# Patient Record
Sex: Male | Born: 1975 | Race: White | Hispanic: No | Marital: Married | Smoking: Never smoker
Health system: Southern US, Community
[De-identification: ages and names within clinical notes are randomized; demographics above are authoritative.]

## PROBLEM LIST (undated history)

## (undated) DIAGNOSIS — I10 Essential (primary) hypertension: Secondary | ICD-10-CM

## (undated) DIAGNOSIS — F988 Other specified behavioral and emotional disorders with onset usually occurring in childhood and adolescence: Secondary | ICD-10-CM

## (undated) DIAGNOSIS — F419 Anxiety disorder, unspecified: Secondary | ICD-10-CM

## (undated) DIAGNOSIS — K219 Gastro-esophageal reflux disease without esophagitis: Secondary | ICD-10-CM

## (undated) DIAGNOSIS — F4310 Post-traumatic stress disorder, unspecified: Secondary | ICD-10-CM

## (undated) DIAGNOSIS — F32A Depression, unspecified: Secondary | ICD-10-CM

## (undated) HISTORY — PX: APPENDECTOMY: SHX54

## (undated) HISTORY — DX: Depression, unspecified: F32.A

---

## 1998-06-21 ENCOUNTER — Emergency Department (HOSPITAL_COMMUNITY): Admission: EM | Admit: 1998-06-21 | Discharge: 1998-06-22 | Payer: Self-pay | Admitting: Emergency Medicine

## 1998-06-23 ENCOUNTER — Emergency Department (HOSPITAL_COMMUNITY): Admission: EM | Admit: 1998-06-23 | Discharge: 1998-06-24 | Payer: Self-pay | Admitting: Emergency Medicine

## 1998-07-15 ENCOUNTER — Ambulatory Visit (HOSPITAL_COMMUNITY): Admission: RE | Admit: 1998-07-15 | Discharge: 1998-07-15 | Payer: Self-pay | Admitting: Psychiatry

## 1998-11-11 ENCOUNTER — Emergency Department (HOSPITAL_COMMUNITY): Admission: EM | Admit: 1998-11-11 | Discharge: 1998-11-11 | Payer: Self-pay | Admitting: Emergency Medicine

## 1999-02-25 ENCOUNTER — Encounter: Payer: Self-pay | Admitting: Emergency Medicine

## 1999-02-25 ENCOUNTER — Emergency Department (HOSPITAL_COMMUNITY): Admission: EM | Admit: 1999-02-25 | Discharge: 1999-02-25 | Payer: Self-pay | Admitting: Emergency Medicine

## 2000-08-19 ENCOUNTER — Emergency Department (HOSPITAL_COMMUNITY): Admission: EM | Admit: 2000-08-19 | Discharge: 2000-08-19 | Payer: Self-pay | Admitting: *Deleted

## 2002-03-18 ENCOUNTER — Ambulatory Visit (HOSPITAL_BASED_OUTPATIENT_CLINIC_OR_DEPARTMENT_OTHER): Admission: RE | Admit: 2002-03-18 | Discharge: 2002-03-18 | Payer: Self-pay | Admitting: Dentistry

## 2003-11-21 ENCOUNTER — Emergency Department (HOSPITAL_COMMUNITY): Admission: EM | Admit: 2003-11-21 | Discharge: 2003-11-21 | Payer: Self-pay | Admitting: Emergency Medicine

## 2006-09-24 ENCOUNTER — Emergency Department (HOSPITAL_COMMUNITY): Admission: EM | Admit: 2006-09-24 | Discharge: 2006-09-24 | Payer: Self-pay | Admitting: Emergency Medicine

## 2006-11-01 ENCOUNTER — Encounter: Admission: RE | Admit: 2006-11-01 | Discharge: 2007-01-30 | Payer: Self-pay | Admitting: Orthopedic Surgery

## 2007-05-18 ENCOUNTER — Emergency Department (HOSPITAL_COMMUNITY): Admission: EM | Admit: 2007-05-18 | Discharge: 2007-05-18 | Payer: Self-pay | Admitting: Emergency Medicine

## 2007-12-10 ENCOUNTER — Emergency Department (HOSPITAL_COMMUNITY): Admission: EM | Admit: 2007-12-10 | Discharge: 2007-12-10 | Payer: Self-pay | Admitting: Family Medicine

## 2008-11-04 ENCOUNTER — Encounter: Admission: RE | Admit: 2008-11-04 | Discharge: 2008-11-04 | Payer: Self-pay | Admitting: Internal Medicine

## 2008-11-12 ENCOUNTER — Encounter: Admission: RE | Admit: 2008-11-12 | Discharge: 2008-11-12 | Payer: Self-pay | Admitting: Internal Medicine

## 2008-12-14 ENCOUNTER — Ambulatory Visit (HOSPITAL_COMMUNITY): Admission: RE | Admit: 2008-12-14 | Discharge: 2008-12-14 | Payer: Self-pay | Admitting: Gastroenterology

## 2008-12-31 ENCOUNTER — Encounter (INDEPENDENT_AMBULATORY_CARE_PROVIDER_SITE_OTHER): Payer: Self-pay | Admitting: Gastroenterology

## 2008-12-31 ENCOUNTER — Ambulatory Visit (HOSPITAL_COMMUNITY): Admission: RE | Admit: 2008-12-31 | Discharge: 2008-12-31 | Payer: Self-pay | Admitting: Gastroenterology

## 2009-02-09 ENCOUNTER — Encounter: Admission: RE | Admit: 2009-02-09 | Discharge: 2009-02-09 | Payer: Self-pay | Admitting: Internal Medicine

## 2009-08-02 ENCOUNTER — Observation Stay (HOSPITAL_COMMUNITY): Admission: EM | Admit: 2009-08-02 | Discharge: 2009-08-03 | Payer: Self-pay | Admitting: Emergency Medicine

## 2009-09-07 ENCOUNTER — Emergency Department (HOSPITAL_COMMUNITY): Admission: EM | Admit: 2009-09-07 | Discharge: 2009-09-07 | Payer: Self-pay | Admitting: Emergency Medicine

## 2010-07-27 ENCOUNTER — Encounter: Admission: RE | Admit: 2010-07-27 | Discharge: 2010-07-27 | Payer: Self-pay | Admitting: Internal Medicine

## 2010-09-12 ENCOUNTER — Encounter: Admission: RE | Admit: 2010-09-12 | Discharge: 2010-09-12 | Payer: Self-pay | Admitting: Internal Medicine

## 2010-09-27 ENCOUNTER — Encounter: Admission: RE | Admit: 2010-09-27 | Discharge: 2010-09-27 | Payer: Self-pay | Admitting: Internal Medicine

## 2011-03-03 LAB — URINALYSIS, ROUTINE W REFLEX MICROSCOPIC
Bilirubin Urine: NEGATIVE
Glucose, UA: NEGATIVE mg/dL
Hgb urine dipstick: NEGATIVE
Ketones, ur: NEGATIVE mg/dL
Nitrite: NEGATIVE
Protein, ur: NEGATIVE mg/dL

## 2011-03-03 LAB — COMPREHENSIVE METABOLIC PANEL
AST: 35 U/L (ref 0–37)
Alkaline Phosphatase: 69 U/L (ref 39–117)
CO2: 23 mEq/L (ref 19–32)
Calcium: 9.7 mg/dL (ref 8.4–10.5)
Creatinine, Ser: 0.97 mg/dL (ref 0.4–1.5)
GFR calc Af Amer: >60 mL/min (ref >60)
Potassium: 3.9 mEq/L (ref 3.5–5.1)
Total Bilirubin: 0.9 mg/dL (ref 0.3–1.2)
Total Protein: 7.7 g/dL (ref 6.0–8.3)

## 2011-03-03 LAB — DIFFERENTIAL
Basophils Absolute: 0 10*3/uL (ref 0.0–0.1)
Lymphocytes Relative: 10 % — ABNORMAL LOW (ref 12–46)
Lymphs Abs: 1.5 10*3/uL (ref 0.7–4.0)
Monocytes Relative: 5 % (ref 3–12)
Neutro Abs: 12.4 10*3/uL — ABNORMAL HIGH (ref 1.7–7.7)
Neutrophils Relative %: 84 % — ABNORMAL HIGH (ref 43–77)

## 2011-03-03 LAB — CBC
Hemoglobin: 15.2 g/dL (ref 13.0–17.0)
MCV: 85.8 fL (ref 78.0–100.0)
RBC: 5.09 MIL/uL (ref 4.22–5.81)
RDW: 13.4 % (ref 11.5–15.5)

## 2011-03-03 LAB — LIPASE, BLOOD: Lipase: 36 U/L (ref 11–59)

## 2011-04-11 NOTE — Op Note (Signed)
Bradley Quinn, Bradley Quinn                ACCOUNT NO.:  [id number]   MEDICAL RECORD NO.:  [id number]          PATIENT TYPE:  AMB   LOCATION:  ENDO                         FACILITY:  [facility]   PHYSICIAN:  Shirley Friar, MDDATE OF BIRTH:  1976-04-01   DATE OF PROCEDURE:  DATE OF DISCHARGE:                               OPERATIVE REPORT   PROCEDURE:  Colonoscopy.   INDICATIONS:  Diarrhea, abdominal pain, vomiting.   MEDICATIONS:  Fentanyl 100 mcg IV, Versed 1 mg IV, propofol infusion 610  mg IV per Anesthesia, lidocaine 100 mg IV.   FINDINGS:  Rectal exam was normal.  A pediatric Pentax colonoscope was  inserted into a fair prepped colon and advanced until the cecum,  ileocecal valve and appendiceal orifice were identified.  The terminal  ileum was intubated and was normal in appearance.  Two biopsies were  taken in the terminal ileum for histology.  On careful withdrawal of the  colonoscope, no mucosal abnormalities were seen.  Retroflexion showed  small internal hemorrhoids.   ASSESSMENT:  1. Small internal hemorrhoids.  2. Otherwise normal colonoscopy and terminal ileum.   PLAN:  Follow-up on path of terminal ileum to check for Crohn's disease.      Shirley Friar, MD  Electronically Signed     VCS/MEDQ  D:  12/31/2008  T:  12/31/2008  Job:  [id number]

## 2011-08-02 ENCOUNTER — Emergency Department (HOSPITAL_COMMUNITY)
Admission: EM | Admit: 2011-08-02 | Discharge: 2011-08-03 | Disposition: A | Discharge status: Home / Self Care | Payer: Self-pay | Attending: Emergency Medicine | Admitting: Emergency Medicine

## 2011-08-02 ENCOUNTER — Emergency Department (HOSPITAL_COMMUNITY): Payer: Self-pay

## 2011-08-02 DIAGNOSIS — R1013 Epigastric pain: Secondary | ICD-10-CM | POA: Insufficient documentation

## 2011-08-02 DIAGNOSIS — K219 Gastro-esophageal reflux disease without esophagitis: Secondary | ICD-10-CM | POA: Insufficient documentation

## 2011-08-02 DIAGNOSIS — K859 Acute pancreatitis without necrosis or infection, unspecified: Secondary | ICD-10-CM | POA: Insufficient documentation

## 2011-08-02 DIAGNOSIS — R112 Nausea with vomiting, unspecified: Secondary | ICD-10-CM | POA: Insufficient documentation

## 2011-08-02 DIAGNOSIS — R10816 Epigastric abdominal tenderness: Secondary | ICD-10-CM | POA: Insufficient documentation

## 2011-08-02 DIAGNOSIS — F341 Dysthymic disorder: Secondary | ICD-10-CM | POA: Insufficient documentation

## 2011-08-02 LAB — CBC
Hemoglobin: 16 g/dL (ref 13.0–17.0)
MCH: 29.3 pg (ref 26.0–34.0)
RBC: 5.46 MIL/uL (ref 4.22–5.81)
RDW: 13.6 % (ref 11.5–15.5)
WBC: 13.9 10*3/uL — ABNORMAL HIGH (ref 4.0–10.5)

## 2011-08-02 LAB — DIFFERENTIAL
Basophils Absolute: 0.1 10*3/uL (ref 0.0–0.1)
Basophils Relative: 1 % (ref 0–1)
Eosinophils Relative: 3 % (ref 0–5)
Lymphocytes Relative: 24 % (ref 12–46)
Lymphs Abs: 3.4 10*3/uL (ref 0.7–4.0)
Monocytes Absolute: 0.7 10*3/uL (ref 0.1–1.0)
Neutro Abs: 9.4 10*3/uL — ABNORMAL HIGH (ref 1.7–7.7)

## 2011-08-03 ENCOUNTER — Emergency Department (HOSPITAL_COMMUNITY): Payer: Self-pay

## 2011-08-03 LAB — COMPREHENSIVE METABOLIC PANEL
CO2: 25 mEq/L (ref 19–32)
Chloride: 100 mEq/L (ref 96–112)
GFR calc non Af Amer: >60 mL/min (ref >60)
Potassium: 3.5 mEq/L (ref 3.5–5.1)
Sodium: 139 mEq/L (ref 135–145)
Total Bilirubin: 0.3 mg/dL (ref 0.3–1.2)

## 2011-08-03 LAB — LIPASE, BLOOD: Lipase: 476 U/L — ABNORMAL HIGH (ref 11–59)

## 2011-08-17 LAB — POCT RAPID STREP A: Streptococcus, Group A Screen (Direct): NEGATIVE

## 2012-07-12 ENCOUNTER — Emergency Department (HOSPITAL_COMMUNITY): Payer: Medicaid Other

## 2012-07-12 ENCOUNTER — Emergency Department (HOSPITAL_COMMUNITY)
Admission: EM | Admit: 2012-07-12 | Discharge: 2012-07-12 | Disposition: A | Discharge status: Home / Self Care | Payer: Medicaid Other | Attending: Emergency Medicine | Admitting: Emergency Medicine

## 2012-07-12 ENCOUNTER — Encounter (HOSPITAL_COMMUNITY): Payer: Self-pay | Admitting: Emergency Medicine

## 2012-07-12 DIAGNOSIS — I10 Essential (primary) hypertension: Secondary | ICD-10-CM | POA: Insufficient documentation

## 2012-07-12 DIAGNOSIS — Z79899 Other long term (current) drug therapy: Secondary | ICD-10-CM | POA: Insufficient documentation

## 2012-07-12 DIAGNOSIS — K219 Gastro-esophageal reflux disease without esophagitis: Secondary | ICD-10-CM | POA: Insufficient documentation

## 2012-07-12 DIAGNOSIS — F4310 Post-traumatic stress disorder, unspecified: Secondary | ICD-10-CM | POA: Insufficient documentation

## 2012-07-12 DIAGNOSIS — R1032 Left lower quadrant pain: Secondary | ICD-10-CM | POA: Insufficient documentation

## 2012-07-12 DIAGNOSIS — R109 Unspecified abdominal pain: Secondary | ICD-10-CM

## 2012-07-12 DIAGNOSIS — F411 Generalized anxiety disorder: Secondary | ICD-10-CM | POA: Insufficient documentation

## 2012-07-12 HISTORY — DX: Gastro-esophageal reflux disease without esophagitis: K21.9

## 2012-07-12 HISTORY — DX: Anxiety disorder, unspecified: F41.9

## 2012-07-12 HISTORY — DX: Other specified behavioral and emotional disorders with onset usually occurring in childhood and adolescence: F98.8

## 2012-07-12 HISTORY — DX: Post-traumatic stress disorder, unspecified: F43.10

## 2012-07-12 HISTORY — DX: Essential (primary) hypertension: I10

## 2012-07-12 LAB — CBC WITH DIFFERENTIAL/PLATELET
Basophils Absolute: 0.1 10*3/uL (ref 0.0–0.1)
Eosinophils Absolute: 0.4 10*3/uL (ref 0.0–0.7)
Eosinophils Relative: 3 % (ref 0–5)
Lymphs Abs: 2.9 10*3/uL (ref 0.7–4.0)
MCH: 29.4 pg (ref 26.0–34.0)
MCV: 85.3 fL (ref 78.0–100.0)
Neutrophils Relative %: 70 % (ref 43–77)
Platelets: 304 10*3/uL (ref 150–400)
RBC: 5.1 MIL/uL (ref 4.22–5.81)
RDW: 14.6 % (ref 11.5–15.5)
WBC: 13.2 10*3/uL — ABNORMAL HIGH (ref 4.0–10.5)

## 2012-07-12 LAB — URINALYSIS, ROUTINE W REFLEX MICROSCOPIC
Glucose, UA: NEGATIVE mg/dL
Ketones, ur: NEGATIVE mg/dL
Leukocytes, UA: NEGATIVE
Nitrite: NEGATIVE
Protein, ur: NEGATIVE mg/dL
Urobilinogen, UA: 0.2 mg/dL (ref 0.0–1.0)

## 2012-07-12 LAB — COMPREHENSIVE METABOLIC PANEL
AST: 64 U/L — ABNORMAL HIGH (ref 0–37)
Alkaline Phosphatase: 47 U/L (ref 39–117)
BUN: 16 mg/dL (ref 6–23)
CO2: 21 mEq/L (ref 19–32)
Chloride: 104 mEq/L (ref 96–112)
Creatinine, Ser: 0.98 mg/dL (ref 0.50–1.35)
GFR calc non Af Amer: >90 mL/min (ref >90)
Potassium: 5 mEq/L (ref 3.5–5.1)
Total Bilirubin: 0.3 mg/dL (ref 0.3–1.2)

## 2012-07-12 MED ORDER — ESOMEPRAZOLE MAGNESIUM 40 MG PO CPDR: 40.0000 mg | DELAYED_RELEASE_CAPSULE | Freq: Every day | ORAL | Status: DC

## 2012-07-12 MED ORDER — HYDROCODONE-ACETAMINOPHEN 5-500 MG PO TABS
1.0000 | ORAL_TABLET | Freq: Four times a day (QID) | ORAL | Status: AC | PRN
Start: 2012-07-12 — End: 2012-07-22

## 2012-07-12 MED ORDER — ONDANSETRON 4 MG PO TBDP
ORAL_TABLET | ORAL | Status: AC
  Filled 2012-07-12: qty 2

## 2012-07-12 MED ORDER — ONDANSETRON 4 MG PO TBDP
8.0000 mg | ORAL_TABLET | Freq: Once | ORAL | Status: AC
  Administered 2012-07-12: 8 mg via ORAL

## 2012-07-12 NOTE — ED Notes (Signed)
Denis injury.  States tenderness bloating feeling, fatigue, generalized weakness,"'Swollen lymphNOIDS" under left arm.

## 2012-07-12 NOTE — ED Notes (Signed)
Pt c/o LUQ pain for 1.5 months  Intermittently.  Past 2 weeks more constant and past 2 days severe.  Nausea tonight secondary to high anxiety.   Mild fevers intermittently, denies diarrhea.  Has not sought help due to PTSD and high anxiety.

## 2012-07-12 NOTE — ED Notes (Signed)
ZOFRAN 8 MG ODT GIVEN AT TRIAGE - PT. NAUSEOUS / VOMITTED X1.

## 2012-07-12 NOTE — ED Provider Notes (Signed)
History     CSN: [id number]  Arrival date & time 07/12/12  0022   First MD Initiated Contact with Patient 07/12/12 [phone]      Chief Complaint  Patient presents with  . Abdominal Pain    (Consider location/radiation/quality/duration/timing/severity/associated sxs/prior treatment) Patient is a 36 y.o. male presenting with abdominal pain. The history is provided by the patient.  Abdominal Pain The primary symptoms of the illness include abdominal pain. The primary symptoms of the illness do not include fever, shortness of breath or dysuria.  Symptoms associated with the illness do not include chills, hematuria or back pain.  pt c/o left upper abd/left flank pain for past month intermittently. Dull pain that waxes/wanes in intensity when present. No specific exacerbating or alleviating factors. No hematuria or dysuria. ?hx kidney stones. No recent or preceding trauma or fall. No fever or chills. Normal appetite. No nv. Having normal bms. Denies any cp or discomfort. No assoc nv, or diaphoresis. No hx pud or pancreatitis. No mid to lower abd pain. No wt loss. Normal appetite.     Past Medical History  Diagnosis Date  . PTSD (post-traumatic stress disorder)   . ADD (attention deficit disorder)   . Anxiety   . GERD (gastroesophageal reflux disease)   . Hypertension     Past Surgical History  Procedure Date  . Appendectomy     No family history on file.  History  Substance Use Topics  . Smoking status: Never Smoker   . Smokeless tobacco: Not on file  . Alcohol Use: No      Review of Systems  Constitutional: Negative for fever and chills.  HENT: Negative for neck pain.   Eyes: Negative for redness.  Respiratory: Negative for cough and shortness of breath.   Cardiovascular: Negative for chest pain and leg swelling.  Gastrointestinal: Positive for abdominal pain.  Genitourinary: Negative for dysuria, hematuria and flank pain.  Musculoskeletal: Negative for back pain.    Skin: Negative for rash.  Neurological: Negative for headaches.  Hematological: Does not bruise/bleed easily.  Psychiatric/Behavioral: Negative for confusion.    Allergies  Iohexol  Home Medications   Current Outpatient Rx  Name Route Sig Dispense Refill  . ARIPIPRAZOLE 2 MG PO TABS Oral Take 2 mg by mouth daily.    . ARIPIPRAZOLE 5 MG PO TABS Oral Take 5 mg by mouth daily.    Marland Kitchen ESOMEPRAZOLE MAGNESIUM 40 MG PO PACK Oral Take 40 mg by mouth daily before breakfast.    . FLUOXETINE HCL 40 MG PO CAPS Oral Take 40 mg by mouth daily.    Marland Kitchen METOCLOPRAMIDE HCL 10 MG PO TABS Oral Take 10 mg by mouth daily.    Marland Kitchen ONDANSETRON HCL 4 MG PO TABS Oral Take 4 mg by mouth daily.    Marland Kitchen PROCHLORPERAZINE MALEATE 5 MG PO TABS Oral Take 5 mg by mouth every 6 (six) hours as needed. For nausea      BP 142/100  Pulse 89  Temp 98 F (36.7 C) (Oral)  Resp 18  SpO2 97%  Physical Exam  Nursing note and vitals reviewed. Constitutional: He is oriented to person, place, and time. He appears well-developed and well-nourished. No distress.  HENT:  Head: Atraumatic.  Eyes: Pupils are equal, round, and reactive to light.  Neck: Neck supple. No tracheal deviation present.  Cardiovascular: Normal rate, regular rhythm, normal heart sounds and intact distal pulses.  Exam reveals no gallop and no friction rub.   No murmur heard. Pulmonary/Chest:  Effort normal and breath sounds normal. No accessory muscle usage. No respiratory distress.  Abdominal: Soft. Bowel sounds are normal. He exhibits no distension and no mass. There is no tenderness. There is no rebound and no guarding.  Genitourinary:       No cva tenderness  Musculoskeletal: Normal range of motion. He exhibits no edema and no tenderness.  Neurological: He is alert and oriented to person, place, and time.  Skin: Skin is warm and dry.    ED Course  Procedures (including critical care time)  Labs Reviewed  COMPREHENSIVE METABOLIC PANEL - Abnormal;  Notable for the following:    Glucose, Bld 100 (*)     AST 64 (*)     ALT 62 (*)     All other components within normal limits  CBC WITH DIFFERENTIAL - Abnormal; Notable for the following:    WBC 13.2 (*)     Neutro Abs 9.2 (*)     All other components within normal limits  URINALYSIS, ROUTINE W REFLEX MICROSCOPIC      MDM  Pt states pcp had requested scan if his symptoms persists. Ct.  Reviewed nursing notes and prior charts for additional history.    Discussed labs w pt, incl mild elev ast/alt.  Discussed ct.  Pt states is out of nexium, needs new rx, will provide.  Given recurrent abd pain/cos, sl elev ast/alt. will provide gi referral.   Also discussed importance primary care follow up.   Pt/fam request something for pain that he can take at home.   Currently resting comfortably. abd soft nt. No fevers. No nv.        Suzi Roots, MD 07/12/12 [phone]

## 2012-07-12 NOTE — ED Notes (Signed)
PT. REPORTS LUQ/ LEFT LATERAL ABDOMINAL PAIN RADIATING TO LEFT AXILLA FOR 1 1/2 MONTHS , DENIES NAUSEA,VOMITTING OR DIARRHEA.

## 2020-05-18 ENCOUNTER — Encounter: Payer: Self-pay | Admitting: Emergency Medicine

## 2020-05-18 ENCOUNTER — Other Ambulatory Visit: Payer: Self-pay

## 2020-05-18 ENCOUNTER — Emergency Department: Payer: Self-pay

## 2020-05-18 ENCOUNTER — Inpatient Hospital Stay
Admission: EM | Admit: 2020-05-18 | Discharge: 2020-05-23 | DRG: 378 | Disposition: A | Discharge status: Home / Self Care | Payer: Self-pay | Attending: Internal Medicine | Admitting: Internal Medicine

## 2020-05-18 DIAGNOSIS — Z801 Family history of malignant neoplasm of trachea, bronchus and lung: Secondary | ICD-10-CM

## 2020-05-18 DIAGNOSIS — R519 Headache, unspecified: Secondary | ICD-10-CM | POA: Diagnosis present

## 2020-05-18 DIAGNOSIS — F329 Major depressive disorder, single episode, unspecified: Secondary | ICD-10-CM | POA: Diagnosis present

## 2020-05-18 DIAGNOSIS — Z8042 Family history of malignant neoplasm of prostate: Secondary | ICD-10-CM

## 2020-05-18 DIAGNOSIS — K228 Other specified diseases of esophagus: Secondary | ICD-10-CM | POA: Diagnosis present

## 2020-05-18 DIAGNOSIS — F32A Depression, unspecified: Secondary | ICD-10-CM | POA: Diagnosis present

## 2020-05-18 DIAGNOSIS — R52 Pain, unspecified: Secondary | ICD-10-CM

## 2020-05-18 DIAGNOSIS — D62 Acute posthemorrhagic anemia: Secondary | ICD-10-CM | POA: Diagnosis present

## 2020-05-18 DIAGNOSIS — Z20822 Contact with and (suspected) exposure to covid-19: Secondary | ICD-10-CM | POA: Diagnosis present

## 2020-05-18 DIAGNOSIS — Z800 Family history of malignant neoplasm of digestive organs: Secondary | ICD-10-CM

## 2020-05-18 DIAGNOSIS — Z79899 Other long term (current) drug therapy: Secondary | ICD-10-CM

## 2020-05-18 DIAGNOSIS — K264 Chronic or unspecified duodenal ulcer with hemorrhage: Principal | ICD-10-CM | POA: Diagnosis present

## 2020-05-18 DIAGNOSIS — F988 Other specified behavioral and emotional disorders with onset usually occurring in childhood and adolescence: Secondary | ICD-10-CM | POA: Diagnosis present

## 2020-05-18 DIAGNOSIS — I25110 Atherosclerotic heart disease of native coronary artery with unstable angina pectoris: Secondary | ICD-10-CM | POA: Diagnosis present

## 2020-05-18 DIAGNOSIS — T39395A Adverse effect of other nonsteroidal anti-inflammatory drugs [NSAID], initial encounter: Secondary | ICD-10-CM | POA: Diagnosis present

## 2020-05-18 DIAGNOSIS — Q402 Other specified congenital malformations of stomach: Secondary | ICD-10-CM

## 2020-05-18 DIAGNOSIS — M25552 Pain in left hip: Secondary | ICD-10-CM | POA: Diagnosis present

## 2020-05-18 DIAGNOSIS — D649 Anemia, unspecified: Secondary | ICD-10-CM | POA: Diagnosis present

## 2020-05-18 DIAGNOSIS — I10 Essential (primary) hypertension: Secondary | ICD-10-CM | POA: Diagnosis present

## 2020-05-18 DIAGNOSIS — K219 Gastro-esophageal reflux disease without esophagitis: Secondary | ICD-10-CM | POA: Diagnosis present

## 2020-05-18 DIAGNOSIS — K920 Hematemesis: Secondary | ICD-10-CM

## 2020-05-18 DIAGNOSIS — F1210 Cannabis abuse, uncomplicated: Secondary | ICD-10-CM | POA: Diagnosis present

## 2020-05-18 DIAGNOSIS — Z7901 Long term (current) use of anticoagulants: Secondary | ICD-10-CM

## 2020-05-18 DIAGNOSIS — F4310 Post-traumatic stress disorder, unspecified: Secondary | ICD-10-CM | POA: Diagnosis present

## 2020-05-18 DIAGNOSIS — K922 Gastrointestinal hemorrhage, unspecified: Secondary | ICD-10-CM

## 2020-05-18 DIAGNOSIS — Y929 Unspecified place or not applicable: Secondary | ICD-10-CM

## 2020-05-18 DIAGNOSIS — R079 Chest pain, unspecified: Secondary | ICD-10-CM | POA: Diagnosis present

## 2020-05-18 LAB — CBC
HCT: 23.5 % — ABNORMAL LOW (ref 39.0–52.0)
HCT: 21.6 % — ABNORMAL LOW (ref 39.0–52.0)
HCT: 24.5 % — ABNORMAL LOW (ref 39.0–52.0)
Hemoglobin: 7.8 g/dL — ABNORMAL LOW (ref 13.0–17.0)
Hemoglobin: 7.5 g/dL — ABNORMAL LOW (ref 13.0–17.0)
Hemoglobin: 8.2 g/dL — ABNORMAL LOW (ref 13.0–17.0)
MCH: 26.9 pg (ref 26.0–34.0)
MCH: 27.7 pg (ref 26.0–34.0)
MCH: 27.3 pg (ref 26.0–34.0)
MCHC: 33.2 g/dL (ref 30.0–36.0)
MCHC: 34.7 g/dL (ref 30.0–36.0)
MCHC: 33.5 g/dL (ref 30.0–36.0)
MCV: 81 fL (ref 80.0–100.0)
MCV: 79.7 fL — ABNORMAL LOW (ref 80.0–100.0)
MCV: 81.7 fL (ref 80.0–100.0)
Platelets: 238 10*3/uL (ref 150–400)
Platelets: 205 10*3/uL (ref 150–400)
Platelets: 207 10*3/uL (ref 150–400)
RBC: 2.9 MIL/uL — ABNORMAL LOW (ref 4.22–5.81)
RBC: 2.71 MIL/uL — ABNORMAL LOW (ref 4.22–5.81)
RBC: 3 MIL/uL — ABNORMAL LOW (ref 4.22–5.81)
RDW: 15.1 % (ref 11.5–15.5)
RDW: 15.2 % (ref 11.5–15.5)
RDW: 14.8 % (ref 11.5–15.5)
WBC: 9.3 10*3/uL (ref 4.0–10.5)
WBC: 7.3 10*3/uL (ref 4.0–10.5)
WBC: 7.3 10*3/uL (ref 4.0–10.5)
nRBC: 0.2 % (ref 0.0–0.2)
nRBC: 0.3 % — ABNORMAL HIGH (ref 0.0–0.2)
nRBC: 0.3 % — ABNORMAL HIGH (ref 0.0–0.2)

## 2020-05-18 LAB — BASIC METABOLIC PANEL
Anion gap: 9 (ref 5–15)
BUN: 14 mg/dL (ref 6–20)
CO2: 28 mmol/L (ref 22–32)
Calcium: 9.3 mg/dL (ref 8.9–10.3)
Chloride: 102 mmol/L (ref 98–111)
Creatinine, Ser: 1.15 mg/dL (ref 0.61–1.24)
GFR calc Af Amer: >60 mL/min (ref >60)
GFR calc non Af Amer: >60 mL/min (ref >60)
Glucose, Bld: 115 mg/dL — ABNORMAL HIGH (ref 70–99)
Potassium: 3.6 mmol/L (ref 3.5–5.1)
Sodium: 139 mmol/L (ref 135–145)

## 2020-05-18 LAB — TROPONIN I (HIGH SENSITIVITY)
Troponin I (High Sensitivity): 15 ng/L (ref <18)
Troponin I (High Sensitivity): 17 ng/L (ref <18)
Troponin I (High Sensitivity): 14 ng/L (ref <18)

## 2020-05-18 LAB — FIBRIN DERIVATIVES D-DIMER (ARMC ONLY): Fibrin derivatives D-dimer (ARMC): 109.28 ng/mL (FEU) (ref 0.00–499.00)

## 2020-05-18 LAB — PROTIME-INR
INR: 1.1 (ref 0.8–1.2)
Prothrombin Time: 13.4 seconds (ref 11.4–15.2)

## 2020-05-18 LAB — SARS CORONAVIRUS 2 BY RT PCR (HOSPITAL ORDER, PERFORMED IN ~~LOC~~ HOSPITAL LAB): SARS Coronavirus 2: NEGATIVE

## 2020-05-18 LAB — PREPARE RBC (CROSSMATCH)

## 2020-05-18 LAB — APTT: aPTT: 27 seconds (ref 24–36)

## 2020-05-18 LAB — ABO/RH: ABO/RH(D): O POS

## 2020-05-18 MED ORDER — AMPHETAMINE-DEXTROAMPHETAMINE 10 MG PO TABS
30.0000 mg | ORAL_TABLET | Freq: Two times a day (BID) | ORAL | Status: DC
  Administered 2020-05-22 – 2020-05-23 (×2): 30 mg via ORAL
  Filled 2020-05-18 (×5): qty 3

## 2020-05-18 MED ORDER — SODIUM CHLORIDE 0.9 % IV SOLN: 10.0000 mL/h | Freq: Once | INTRAVENOUS | Status: DC

## 2020-05-18 MED ORDER — HYDRALAZINE HCL 20 MG/ML IJ SOLN
5.0000 mg | INTRAMUSCULAR | Status: DC | PRN
  Administered 2020-05-18: 5 mg via INTRAVENOUS
  Filled 2020-05-18: qty 1

## 2020-05-18 MED ORDER — ONDANSETRON HCL 4 MG/2ML IJ SOLN
4.0000 mg | Freq: Three times a day (TID) | INTRAMUSCULAR | Status: DC | PRN
  Administered 2020-05-19: 4 mg via INTRAVENOUS
  Filled 2020-05-18: qty 2

## 2020-05-18 MED ORDER — ALPRAZOLAM 0.5 MG PO TABS
0.5000 mg | ORAL_TABLET | Freq: Three times a day (TID) | ORAL | Status: DC | PRN
  Administered 2020-05-20: 0.5 mg via ORAL
  Filled 2020-05-18: qty 1

## 2020-05-18 MED ORDER — SODIUM CHLORIDE 0.9 % IV SOLN: INTRAVENOUS | Status: DC

## 2020-05-18 MED ORDER — PANTOPRAZOLE SODIUM 40 MG IV SOLR
8.0000 mg/h | INTRAVENOUS | Status: AC
  Administered 2020-05-18 – 2020-05-21 (×6): 8 mg/h via INTRAVENOUS
  Filled 2020-05-18 (×7): qty 80

## 2020-05-18 MED ORDER — FLUOXETINE HCL 20 MG PO CAPS
20.0000 mg | ORAL_CAPSULE | Freq: Every day | ORAL | Status: DC
  Administered 2020-05-18 – 2020-05-22 (×5): 20 mg via ORAL
  Filled 2020-05-18 (×6): qty 1

## 2020-05-18 MED ORDER — MORPHINE SULFATE (PF) 2 MG/ML IV SOLN
2.0000 mg | INTRAVENOUS | Status: DC | PRN
  Administered 2020-05-18 – 2020-05-19 (×3): 2 mg via INTRAVENOUS
  Filled 2020-05-18 (×3): qty 1

## 2020-05-18 MED ORDER — RAMELTEON 8 MG PO TABS
8.0000 mg | ORAL_TABLET | Freq: Every evening | ORAL | Status: DC | PRN
  Administered 2020-05-20: 8 mg via ORAL
  Filled 2020-05-18 (×2): qty 1

## 2020-05-18 MED ORDER — NITROGLYCERIN 0.4 MG SL SUBL
0.4000 mg | SUBLINGUAL_TABLET | SUBLINGUAL | Status: AC | PRN
  Administered 2020-05-18 (×3): 0.4 mg via SUBLINGUAL
  Filled 2020-05-18: qty 1

## 2020-05-18 MED ORDER — HYDRALAZINE HCL 20 MG/ML IJ SOLN
10.0000 mg | INTRAMUSCULAR | Status: DC | PRN
  Administered 2020-05-18 – 2020-05-23 (×4): 10 mg via INTRAVENOUS
  Filled 2020-05-18 (×4): qty 1

## 2020-05-18 MED ORDER — SODIUM CHLORIDE 0.9 % IV BOLUS
1000.0000 mL | Freq: Once | INTRAVENOUS | Status: AC
  Administered 2020-05-18: 1000 mL via INTRAVENOUS

## 2020-05-18 MED ORDER — SODIUM CHLORIDE 0.9 % IV SOLN
80.0000 mg | Freq: Once | INTRAVENOUS | Status: AC
  Administered 2020-05-18: 80 mg via INTRAVENOUS
  Filled 2020-05-18: qty 80

## 2020-05-18 MED ORDER — ACETAMINOPHEN 325 MG PO TABS
650.0000 mg | ORAL_TABLET | Freq: Four times a day (QID) | ORAL | Status: DC | PRN
  Administered 2020-05-18 – 2020-05-21 (×4): 650 mg via ORAL
  Filled 2020-05-18 (×3): qty 2

## 2020-05-18 MED ORDER — PANTOPRAZOLE SODIUM 40 MG IV SOLR
40.0000 mg | Freq: Two times a day (BID) | INTRAVENOUS | Status: DC
Start: 2020-05-22 — End: 2020-05-22
  Filled 2020-05-18: qty 40

## 2020-05-18 NOTE — ED Triage Notes (Signed)
Pt c/o left chest pain for 1 week that was intermittent that has become more constant.  + nausea and DOE.  Currently unlabored.  New inverted t waves.

## 2020-05-18 NOTE — H&P (Signed)
History and Physical    Bradley Quinn UYQ:[id number] DOB: Feb 20, 1976 DOA: 05/18/2020  Referring MD/NP/PA:   PCP: Delia Chimes, NP (Inactive)   Patient coming from:  The patient is coming from home.  At baseline, pt is independent for most of ADL.        Chief Complaint: Hematemesis, black stool, chest pain and shortness of breath  HPI: Bradley Quinn is a 44 y.o. male with medical history significant of hypertension, GERD, depression, anxiety, PTSD, ADD, who presents with hematemesis, black stool, chest pain and shortness of breath.  Patient states that he has been having intermittent hematemesis for 1 week.  He vomited up 2-3 times of coffee-ground materials.  He has dark tarry stool for about 5 times.  He has epigastric abdominal pain, which is constant, sharp, 7 out of 10 severity, nonradiating.  He had 1 loose stool bowel movement earlier today, but currently no diarrhea.  He also reports chest pain, which is located in left side of chest, sharp, 7 out of 10 severity, radiating to the left axillary area.  It is slightly aggravated by deep breaths. No recent long distant traveling.  Denies fever or chills.  No symptoms of UTI or unilateral weakness.  Patient states that he is taking BC Goody powder at home.  ED Course: pt was found to have hemoglobin 7.8 (hemoglobin 15.0 on 07/12/2012), troponin 15 -->17, negative D-dimer, electrolytes renal function okay, temperature normal, blood pressure 165, 114, heart rate 72, RR 16, oxygen saturation 100% on room air.  Chest x-ray negative.  Patient is placed on progressive bed of observation.  Dr. Vicente Males of GI is consulted.   Review of Systems:   General: no fevers, chills, no body weight gain, has poor appetite, has fatigue HEENT: no blurry vision, hearing changes or sore throat Respiratory: Has dyspnea, coughing, no wheezing CV: Has chest pain, no palpitations GI: Has nausea, hematemesis, epigastric abdominal pain, no diarrhea, constipation GU:  no dysuria, burning on urination, increased urinary frequency, hematuria  Ext: no aggravated with deep breath edema Neuro: no unilateral weakness, numbness, or tingling, no vision change or hearing loss Skin: no rash, no skin tear. MSK: No muscle spasm, no deformity, no limitation of range of movement in spin Heme: No easy bruising.  Travel history: No recent long distant travel.  Allergy:  Allergies  Allergen Reactions  . Iohexol      Code: RASH, Desc: Itching 5 minutes after omnipaque injection., Onset Date: [id number]     Past Medical History:  Diagnosis Date  . ADD (attention deficit disorder)   . Anxiety   . GERD (gastroesophageal reflux disease)   . Hypertension   . PTSD (post-traumatic stress disorder)     Past Surgical History:  Procedure Laterality Date  . APPENDECTOMY      Social History:  reports that he has never smoked. He has never used smokeless tobacco. He reports that he does not drink alcohol and does not use drugs.  Family History:  Family History  Problem Relation Age of Onset  . Lung cancer Father   . Prostate cancer Father   . Liver cancer Father   . Kidney Stones Sister   . Kidney Stones Brother      Prior to Admission medications   Medication Sig Start Date End Date Taking? Authorizing Provider  FLUoxetine (PROZAC) 20 MG capsule Take 20 mg by mouth daily. 04/30/20  Yes [provider]  ARIPiprazole (ABILIFY) 5 MG tablet Take 5 mg by mouth  daily.    [provider]  esomeprazole (NEXIUM) 40 MG packet Take 40 mg by mouth daily before breakfast.    [provider]    Physical Exam: Vitals:   05/18/20 1442 05/18/20 1500 05/18/20 1633 05/18/20 1700  BP: (!) 153/96 (!) 141/89 (!) 156/115 (!) 155/103  Pulse: 79 77 94 75  Resp: (!) 9 10 18 14   Temp:   98.7 F (37.1 C)   TempSrc:   Oral   SpO2: 100% 99%  100%  Weight:      Height:       General: Not in acute distress.  Pale looking HEENT:       Eyes: PERRL, EOMI, no  scleral icterus.       ENT: No discharge from the ears and nose, no pharynx injection, no tonsillar enlargement.        Neck: No JVD, no bruit, no mass felt. Heme: No neck lymph node enlargement. Cardiac: S1/S2, RRR, No murmurs, No gallops or rubs. Respiratory: No rales, wheezing, rhonchi or rubs. GI: Soft, nondistended, has tenderness in epigastric area, no rebound pain, no organomegaly, BS present. GU: No hematuria Ext: No pitting leg edema bilaterally. 1+DP/PT pulse bilaterally. Musculoskeletal: No joint deformities, No joint redness or warmth, no limitation of ROM in spin. Skin: No rashes.  Neuro: Alert, oriented X3, cranial nerves II-XII grossly intact, moves all extremities normally.  Psych: Patient is not psychotic, no suicidal or hemocidal ideation.  Labs on Admission: I have personally reviewed following labs and imaging studies  CBC: Recent Labs  Lab 05/18/20 1330 05/18/20 1523  WBC 9.3 7.3  HGB 7.8* 7.5*  HCT 23.5* 21.6*  MCV 81.0 79.7*  PLT 238 941   Basic Metabolic Panel: Recent Labs  Lab 05/18/20 1330  NA 139  K 3.6  CL 102  CO2 28  GLUCOSE 115*  BUN 14  CREATININE 1.15  CALCIUM 9.3   GFR: Estimated Creatinine Clearance: 87.6 mL/min (by C-G formula based on SCr of 1.15 mg/dL). Liver Function Tests: No results for input(s): AST, ALT, ALKPHOS, BILITOT, PROT, ALBUMIN in the last 168 hours. No results for input(s): LIPASE, AMYLASE in the last 168 hours. No results for input(s): AMMONIA in the last 168 hours. Coagulation Profile: No results for input(s): INR, PROTIME in the last 168 hours. Cardiac Enzymes: No results for input(s): CKTOTAL, CKMB, CKMBINDEX, TROPONINI in the last 168 hours. BNP (last 3 results) No results for input(s): PROBNP in the last 8760 hours. HbA1C: No results for input(s): HGBA1C in the last 72 hours. CBG: No results for input(s): GLUCAP in the last 168 hours. Lipid Profile: No results for input(s): CHOL, HDL, LDLCALC, TRIG,  CHOLHDL, LDLDIRECT in the last 72 hours. Thyroid Function Tests: No results for input(s): TSH, T4TOTAL, FREET4, T3FREE, THYROIDAB in the last 72 hours. Anemia Panel: No results for input(s): VITAMINB12, FOLATE, FERRITIN, TIBC, IRON, RETICCTPCT in the last 72 hours. Urine analysis:    Component Value Date/Time   COLORURINE YELLOW 07/12/2012 0240   APPEARANCEUR CLEAR 07/12/2012 0240   LABSPEC 1.018 07/12/2012 0240   PHURINE 6.0 07/12/2012 0240   GLUCOSEU NEGATIVE 07/12/2012 0240   HGBUR NEGATIVE 07/12/2012 0240   BILIRUBINUR NEGATIVE 07/12/2012 0240   KETONESUR NEGATIVE 07/12/2012 0240   PROTEINUR NEGATIVE 07/12/2012 0240   UROBILINOGEN 0.2 07/12/2012 0240   NITRITE NEGATIVE 07/12/2012 0240   LEUKOCYTESUR NEGATIVE 07/12/2012 0240   Sepsis Labs: @LABRCNTIP (procalcitonin:4,lacticidven:4) ) Recent Results (from the past 240 hour(s))  SARS Coronavirus 2 by RT PCR (  hospital order, performed in [facility] hospital lab) Nasopharyngeal Nasopharyngeal Swab     Status: None   Collection Time: 05/18/20  3:23 PM   Specimen: Nasopharyngeal Swab  Result Value Ref Range Status   SARS Coronavirus 2 NEGATIVE NEGATIVE Final    Comment: (NOTE) SARS-CoV-2 target nucleic acids are NOT DETECTED.  The SARS-CoV-2 RNA is generally detectable in upper and lower respiratory specimens during the acute phase of infection. The lowest concentration of SARS-CoV-2 viral copies this assay can detect is 250 copies / mL. A negative result does not preclude SARS-CoV-2 infection and should not be used as the sole basis for treatment or other patient management decisions.  A negative result may occur with improper specimen collection / handling, submission of specimen other than nasopharyngeal swab, presence of viral mutation(s) within the areas targeted by this assay, and inadequate number of viral copies (<250 copies / mL). A negative result must be combined with clinical observations, patient history, and  epidemiological information.  Fact Sheet for Patients:   StrictlyIdeas.no  Fact Sheet for Healthcare Providers: BankingDealers.co.za  This test is not yet approved or  cleared by the [place] FDA and has been authorized for detection and/or diagnosis of SARS-CoV-2 by FDA under an Emergency Use Authorization (EUA).  This EUA will remain in effect (meaning this test can be used) for the duration of the COVID-19 declaration under Section 564(b)(1) of the Act, 21 U.S.C. section 360bbb-3(b)(1), unless the authorization is terminated or revoked sooner.  Performed at [facility], [address] [place], [place] [address]      Radiological Exams on Admission: DG Chest 2 View  Result Date: 05/18/2020 CLINICAL DATA:  Chest pain and shortness of breath for 1 week EXAM: CHEST - 2 VIEW COMPARISON:  09/27/2010 FINDINGS: The heart size and mediastinal contours are within normal limits. Both lungs are clear. The visualized skeletal structures are unremarkable. IMPRESSION: No active cardiopulmonary disease. Electronically Signed   By: Inez Catalina M.D.   On: 05/18/2020 14:06     EKG: Independently reviewed.  Sinus rhythm, QTC 423, ST depression in lateral leads, V4-V6, and II  Assessment/Plan Principal Problem:   Hematemesis Active Problems:   GERD (gastroesophageal reflux disease)   Depression   HTN (hypertension)   Symptomatic anemia   Acute blood loss anemia   Chest pain   ADD (attention deficit disorder)   Hematemesis:  Hgb 15 -->7.8.  Dynamically stable.  Possibly due to upper GI bleeding.  Patient is taking BC Goody powder. Dr. Vicente Males is consulted.  - will place in med-surg bed obs - transfuse 1 units of blood which is ordered by EDP - GI consulted by Ed, will follow up recommendations - NPO for possible EGD - IVF: 1L NS bolus, then at 100 mL/hr - Start IV pantoprazole gtt - Zofran IV for nausea - Avoid NSAIDs and  SQ heparin - Maintain IV access (2 large bore IVs if possible). - Monitor closely and follow q6h cbc, transfuse as necessary, if Hgb<7.0 - La[facility]: INR, PTT and type screen  GERD (gastroesophageal reflux disease) -on IV protonix  Depression -Prozac  HTN (hypertension): Patient is not taking medications at home -IV hydralazine as needed  Symptomatic anemia due to acute blood loss anemia -Transfuse 1 unit of blood as above  Chest pain: Very atypical chest pain.  Troponin negative.  D-dimer negative -Trend troponin -Check A1c, UDS, FLP -Repeat EKG in the morning  ADD (attention deficit disorder) -Adderall  DVT ppx: SCD Code Status: Full code Family Communication:  Yes, patient's  wife  at bed side Disposition Plan:  Anticipate discharge back to previous environment Consults called:  Dr. Vicente Males Admission status:  progressive unit for obs     Status is: Observation  The patient remains OBS appropriate and will d/c before 2 midnights.  Dispo: The patient is from: Home              Anticipated d/c is to: Home              Anticipated d/c date is: 1 day              Patient currently is not medically stable to d/c.          Date of Service 05/18/2020    Ivor Costa [facility]   If 7PM-7AM, please contact night-coverage [email or web address] 05/18/2020, 5:18 PM

## 2020-05-18 NOTE — ED Notes (Signed)
ED TO INPATIENT HANDOFF REPORT  ED Nurse Name and Phone #: [phone]  S Name/Age/Gender Bradley Quinn 44 y.o. male Room/Bed: ED02A/ED02A  Code Status   Code Status: Not on file  Home/SNF/Other Home Patient oriented to: self, place, time and situation Is this baseline? Yes   Triage Complete: Triage complete  Chief Complaint Hematemesis [K92.0]  Triage Note Pt c/o left chest pain for 1 week that was intermittent that has become more constant.  + nausea and DOE.  Currently unlabored.  New inverted t waves.    Allergies Allergies  Allergen Reactions  . Iohexol      Code: RASH, Desc: Itching 5 minutes after omnipaque injection., Onset Date: [id number]     Level of Care/Admitting Diagnosis ED Disposition    ED Disposition Condition [place]: [place] [100120]  Level of Care: Progressive Cardiac [106]  Admit to Progressive based on following criteria: Other see comments  Comments: Hematemesis and chest pain  Covid Evaluation: Asymptomatic Screening Protocol (No Symptoms)  Diagnosis: Hematemesis [578.0.ICD-9-CM]  Admitting Physician: Ivor Costa [place]  Attending Physician: Ivor Costa [[id number]]       B Medical/Surgery History Past Medical History:  Diagnosis Date  . ADD (attention deficit disorder)   . Anxiety   . GERD (gastroesophageal reflux disease)   . Hypertension   . PTSD (post-traumatic stress disorder)    Past Surgical History:  Procedure Laterality Date  . APPENDECTOMY       A IV Location/Drains/Wounds Patient Lines/Drains/Airways Status    Active Line/Drains/Airways    Name Placement date Placement time Site Days   Peripheral IV 05/18/20 Right Antecubital 05/18/20  1434  Antecubital  less than 1   Peripheral IV 05/18/20 Left Antecubital 05/18/20  1451  Antecubital  less than 1          Intake/Output Last 24 hours  Intake/Output Summary (Last 24 hours) at 05/18/2020 1728 Last data filed at  05/18/2020 1633 Gross per 24 hour  Intake 380 ml  Output --  Net 380 ml    Labs/Imaging Results for orders placed or performed during the hospital encounter of 05/18/20 (from the past 48 hour(s))  Basic metabolic panel     Status: Abnormal   Collection Time: 05/18/20  1:30 PM  Result Value Ref Range   Sodium 139 135 - 145 mmol/L   Potassium 3.6 3.5 - 5.1 mmol/L   Chloride 102 98 - 111 mmol/L   CO2 28 22 - 32 mmol/L   Glucose, Bld 115 (H) 70 - 99 mg/dL    Comment: Glucose reference range applies only to samples taken after fasting for at least 8 hours.   BUN 14 6 - 20 mg/dL   Creatinine, Ser 1.15 0.61 - 1.24 mg/dL   Calcium 9.3 8.9 - 10.3 mg/dL   GFR calc non Af Amer >60 >60 mL/min   GFR calc Af Amer >60 >60 mL/min   Anion gap 9 5 - 15    Comment: Performed at [facility], [address] [place], [place] [address]  CBC     Status: Abnormal   Collection Time: 05/18/20  1:30 PM  Result Value Ref Range   WBC 9.3 4.0 - 10.5 K/uL   RBC 2.90 (L) 4.22 - 5.81 MIL/uL   Hemoglobin 7.8 (L) 13.0 - 17.0 g/dL   HCT 23.5 (L) 39 - 52 %   MCV 81.0 80.0 - 100.0 fL   MCH 26.9 26.0 - 34.0  pg   MCHC 33.2 30.0 - 36.0 g/dL   RDW 15.1 11.5 - 15.5 %   Platelets 238 150 - 400 K/uL   nRBC 0.2 0.0 - 0.2 %    Comment: Performed at [facility], [place], [place] [address]  Troponin I (High Sensitivity)     Status: None   Collection Time: 05/18/20  1:30 PM  Result Value Ref Range   Troponin I (High Sensitivity) 15 <18 ng/L    Comment: (NOTE) Elevated high sensitivity troponin I (hsTnI) values and significant  changes across serial measurements may suggest ACS but many other  chronic and acute conditions are known to elevate hsTnI results.  Refer to the "Links" section for chest pain algorithms and additional  guidance. Performed at [facility], [address] [place], [place] [address]   ABO/Rh     Status: None   Collection Time: 05/18/20  1:31 PM   Result Value Ref Range   ABO/RH(D)      O POS Performed at [facility], [address] [place], [place] [address]   Prepare RBC (crossmatch)     Status: None   Collection Time: 05/18/20  2:13 PM  Result Value Ref Range   Order Confirmation      ORDER PROCESSED BY BLOOD BANK Performed at [facility], [address] [place], [place] [address]   Type and screen [place]     Status: None (Preliminary result)   Collection Time: 05/18/20  2:31 PM  Result Value Ref Range   ABO/RH(D) O POS    Antibody Screen NEG    Sample Expiration 05/21/2020,2359    Unit Number [id number]    Blood Component Type RED CELLS,LR    Unit division 00    Status of Unit ISSUED    Transfusion Status OK TO TRANSFUSE    Crossmatch Result      Compatible Performed at [facility], [address] [place], [place] [address]   CBC     Status: Abnormal   Collection Time: 05/18/20  3:23 PM  Result Value Ref Range   WBC 7.3 4.0 - 10.5 K/uL   RBC 2.71 (L) 4.22 - 5.81 MIL/uL   Hemoglobin 7.5 (L) 13.0 - 17.0 g/dL   HCT 21.6 (L) 39 - 52 %   MCV 79.7 (L) 80.0 - 100.0 fL   MCH 27.7 26.0 - 34.0 pg   MCHC 34.7 30.0 - 36.0 g/dL   RDW 15.2 11.5 - 15.5 %   Platelets 205 150 - 400 K/uL   nRBC 0.3 (H) 0.0 - 0.2 %    Comment: Performed at [facility], [address] [place], [place] [address]  Troponin I (High Sensitivity)     Status: None   Collection Time: 05/18/20  3:23 PM  Result Value Ref Range   Troponin I (High Sensitivity) 17 <18 ng/L    Comment: (NOTE) Elevated high sensitivity troponin I (hsTnI) values and significant  changes across serial measurements may suggest ACS but many other  chronic and acute conditions are known to elevate hsTnI results.  Refer to the "Links" section for chest pain algorithms and additional  guidance. Performed at [facility], [address] [place],  [address]   SARS Coronavirus 2 by  RT PCR (hospital order, performed in [facility] hospital lab) Nasopharyngeal Nasopharyngeal Swab     Status: None   Collection Time: 05/18/20  3:23 PM   Specimen: Nasopharyngeal  Swab  Result Value Ref Range   SARS Coronavirus 2 NEGATIVE NEGATIVE    Comment: (NOTE) SARS-CoV-2 target nucleic acids are NOT DETECTED.  The SARS-CoV-2 RNA is generally detectable in upper and lower respiratory specimens during the acute phase of infection. The lowest concentration of SARS-CoV-2 viral copies this assay can detect is 250 copies / mL. A negative result does not preclude SARS-CoV-2 infection and should not be used as the sole basis for treatment or other patient management decisions.  A negative result may occur with improper specimen collection / handling, submission of specimen other than nasopharyngeal swab, presence of viral mutation(s) within the areas targeted by this assay, and inadequate number of viral copies (<250 copies / mL). A negative result must be combined with clinical observations, patient history, and epidemiological information.  Fact Sheet for Patients:   StrictlyIdeas.no  Fact Sheet for Healthcare Providers: BankingDealers.co.za  This test is not yet approved or  cleared by the [place] FDA and has been authorized for detection and/or diagnosis of SARS-CoV-2 by FDA under an Emergency Use Authorization (EUA).  This EUA will remain in effect (meaning this test can be used) for the duration of the COVID-19 declaration under Section 564(b)(1) of the Act, 21 U.S.C. section 360bbb-3(b)(1), unless the authorization is terminated or revoked sooner.  Performed at [facility], [address] [place], [place] [address]    DG Chest 2 View  Result Date: 05/18/2020 CLINICAL DATA:  Chest pain and shortness of breath for 1 week EXAM: CHEST - 2 VIEW COMPARISON:  09/27/2010 FINDINGS: The heart size and mediastinal  contours are within normal limits. Both lungs are clear. The visualized skeletal structures are unremarkable. IMPRESSION: No active cardiopulmonary disease. Electronically Signed   By: Inez Catalina M.D.   On: 05/18/2020 14:06    Pending Labs Unresulted Labs (From admission, onward) Comment          Start     Ordered   05/18/20 1650  Fibrin derivatives D-Dimer ([facility] only)  ONCE - STAT,   STAT        05/18/20 1649   05/18/20 1457  CBC  Now then every 6 hours,   STAT      05/18/20 1456   Signed and Held  HIV Antibody (routine testing w rflx)  (HIV Antibody (Routine testing w reflex) panel)  Once,   R        Signed and Held   Signed and Held  Basic metabolic panel  Tomorrow morning,   R        Signed and Held   Signed and Held  CBC  Tomorrow morning,   R        Signed and Held   Signed and Held  APTT  Once,   R        Signed and Held   Signed and Held  Protime-INR  Once,   R        Signed and Held          Vitals/Pain Today's Vitals   05/18/20 1442 05/18/20 1500 05/18/20 1633 05/18/20 1700  BP: (!) 153/96 (!) 141/89 (!) 156/115 (!) 155/103  Pulse: 79 77 94 75  Resp: (!) 9 10 18 14   Temp:   98.7 F (37.1 C)   TempSrc:   Oral   SpO2: 100% 99%  100%  Weight:      Height:      PainSc:        Isolation Precautions No  active isolations  Medications Medications  pantoprazole (PROTONIX) 80 mg in sodium chloride 0.9 % 100 mL (0.8 mg/mL) infusion (8 mg/hr Intravenous New Bag/Given 05/18/20 1449)  pantoprazole (PROTONIX) injection 40 mg (has no administration in time range)  0.9 %  sodium chloride infusion (has no administration in time range)  0.9 %  sodium chloride infusion ( Intravenous New Bag/Given 05/18/20 1522)  ondansetron (ZOFRAN) injection 4 mg (has no administration in time range)  acetaminophen (TYLENOL) tablet 650 mg (has no administration in time range)  morphine 2 MG/ML injection 2 mg (has no administration in time range)  hydrALAZINE (APRESOLINE) injection 5 mg  (has no administration in time range)  pantoprazole (PROTONIX) 80 mg in sodium chloride 0.9 % 100 mL IVPB (0 mg Intravenous Stopped 05/18/20 1508)  sodium chloride 0.9 % bolus 1,000 mL (0 mLs Intravenous Stopped 05/18/20 1706)    Mobility walks Low fall risk   Focused Assessments    R Recommendations: See Admitting Provider Note  Report given to:

## 2020-05-18 NOTE — ED Provider Notes (Signed)
[facility] Emergency Department Provider Note    First MD Initiated Contact with Patient 05/18/20 1403     (approximate)  I have reviewed the triage vital signs and the nursing notes.   HISTORY  Chief Complaint Chest Pain    HPI Bradley Quinn is a 44 y.o. male below listed past medical history presents to the ER for evaluation of generalized weakness malaise.  States his symptoms started on Thursday when he had several episodes of coffee-ground emesis and has been having dark tarry black stools since then.  Does have a history of reflux but was never had endoscopy or told her bleeding was coming from.  Is on anticoagulation does not smoke.  Does not drink alcohol.  States he has been taking BC Goody's for the past several days.    Past Medical History:  Diagnosis Date  . ADD (attention deficit disorder)   . Anxiety   . GERD (gastroesophageal reflux disease)   . Hypertension   . PTSD (post-traumatic stress disorder)    History reviewed. No pertinent family history. Past Surgical History:  Procedure Laterality Date  . APPENDECTOMY     There are no problems to display for this patient.     Prior to Admission medications   Medication Sig Start Date End Date Taking? Authorizing Provider  FLUoxetine (PROZAC) 20 MG capsule Take 20 mg by mouth daily. 04/30/20  Yes [provider]  ARIPiprazole (ABILIFY) 5 MG tablet Take 5 mg by mouth daily.    [provider]  esomeprazole (NEXIUM) 40 MG packet Take 40 mg by mouth daily before breakfast.    [provider]    Allergies Iohexol    Social History Social History   Tobacco Use  . Smoking status: Never Smoker  . Smokeless tobacco: Never Used  Substance Use Topics  . Alcohol use: No  . Drug use: No    Review of Systems Patient denies headaches, rhinorrhea, blurry vision, numbness, shortness of breath, chest pain, edema, cough, abdominal pain, nausea, vomiting,  diarrhea, dysuria, fevers, rashes or hallucinations unless otherwise stated above in HPI. ____________________________________________   PHYSICAL EXAM:  VITAL SIGNS: Vitals:   05/18/20 1328 05/18/20 1330  BP: (!) 165/114   Pulse: 72   Resp: 16   Temp:  98.3 F (36.8 C)  SpO2: 100%     Constitutional: Alert and oriented. Pale appearing Eyes: Conjunctivae are normal.  Head: Atraumatic. Nose: No congestion/rhinnorhea. Mouth/Throat: Mucous membranes are moist.   Neck: No stridor. Painless ROM.  Cardiovascular: Normal rate, regular rhythm. Grossly normal heart sounds.  Good peripheral circulation. Respiratory: Normal respiratory effort.  No retractions. Lungs CTAB. Gastrointestinal: Soft and nontender. No distention. No abdominal bruits. No CVA tenderness. Genitourinary:  Musculoskeletal: No lower extremity tenderness nor edema.  No joint effusions. Neurologic:  Normal speech and language. No gross focal neurologic deficits are appreciated. No facial droop Skin:  Skin is warm, dry and intact. No rash noted. Psychiatric: Mood and affect are normal. Speech and behavior are normal.  ____________________________________________   LABS (all labs ordered are listed, but only abnormal results are displayed)  Results for orders placed or performed during the hospital encounter of 05/18/20 (from the past 24 hour(s))  Basic metabolic panel     Status: Abnormal   Collection Time: 05/18/20  1:30 PM  Result Value Ref Range   Sodium 139 135 - 145 mmol/L   Potassium 3.6 3.5 - 5.1 mmol/L   Chloride 102 98 - 111  mmol/L   CO2 28 22 - 32 mmol/L   Glucose, Bld 115 (H) 70 - 99 mg/dL   BUN 14 6 - 20 mg/dL   Creatinine, Ser 1.15 0.61 - 1.24 mg/dL   Calcium 9.3 8.9 - 10.3 mg/dL   GFR calc non Af Amer >60 >60 mL/min   GFR calc Af Amer >60 >60 mL/min   Anion gap 9 5 - 15  CBC     Status: Abnormal   Collection Time: 05/18/20  1:30 PM  Result Value Ref Range   WBC 9.3 4.0 - 10.5 K/uL   RBC  2.90 (L) 4.22 - 5.81 MIL/uL   Hemoglobin 7.8 (L) 13.0 - 17.0 g/dL   HCT 23.5 (L) 39 - 52 %   MCV 81.0 80.0 - 100.0 fL   MCH 26.9 26.0 - 34.0 pg   MCHC 33.2 30.0 - 36.0 g/dL   RDW 15.1 11.5 - 15.5 %   Platelets 238 150 - 400 K/uL   nRBC 0.2 0.0 - 0.2 %  Troponin I (High Sensitivity)     Status: None   Collection Time: 05/18/20  1:30 PM  Result Value Ref Range   Troponin I (High Sensitivity) 15 <18 ng/L  Prepare RBC (crossmatch)     Status: None (Preliminary result)   Collection Time: 05/18/20  2:13 PM  Result Value Ref Range   Order Confirmation PENDING    ____________________________________________  EKG My review and personal interpretation at Time: 13:23   Indication: weakness  Rate: 75  Rhythm: sinus Axis: normal Other: twi, no stemi stemi criteria ____________________________________________  RADIOLOGY  I personally reviewed all radiographic images ordered to evaluate for the above acute complaints and reviewed radiology reports and findings.  These findings were personally discussed with the patient.  Please see medical record for radiology report.  ____________________________________________   PROCEDURES  Procedure(s) performed:  .Critical Care Performed by: Merlyn Lot, MD Authorized by: Merlyn Lot, MD   Critical care provider statement:    Critical care time (minutes):  35   Critical care time was exclusive of:  Separately billable procedures and treating other patients   Critical care was necessary to treat or prevent imminent or life-threatening deterioration of the following conditions:  Circulatory failure   Critical care was time spent personally by me on the following activities:  Development of treatment plan with patient or surrogate, discussions with consultants, evaluation of patient's response to treatment, examination of patient, obtaining history from patient or surrogate, ordering and performing treatments and interventions, ordering and  review of laboratory studies, ordering and review of radiographic studies, pulse oximetry, re-evaluation of patient's condition and review of old charts      Critical Care performed: yes ____________________________________________   INITIAL IMPRESSION / [place] / ED COURSE  Pertinent labs & imaging results that were available during my care of the patient were reviewed by me and considered in my medical decision making (see chart for details).   DDX: ugi, lgi, pud, cirrhosis, abla, acs  Bradley Quinn is a 44 y.o. who presents to the ED with symptoms as described above.  Patient presenting with symptoms suggestive of upper GI bleed.  Does have evidence of acute anemia with new T wave inversions and given his persistent melena I do suspect persistent bleeding will order single unit transfusion particular in setting of his new T wave inversions.  Does not complain of any chest pain right now.  Will start IV Protonix infusion.  Suspect PUD particularly  given his recent use of NSAIDs.  No history of cirrhosis.  Will discuss with hospitalist for admission.     The patient was evaluated in Emergency Department today for the symptoms described in the history of present illness. He/she was evaluated in the context of the global COVID-19 pandemic, which necessitated consideration that the patient might be at risk for infection with the SARS-CoV-2 virus that causes COVID-19. Institutional protocols and algorithms that pertain to the evaluation of patients at risk for COVID-19 are in a state of rapid change based on information released by regulatory bodies including the CDC and federal and state organizations. These policies and algorithms were followed during the patient's care in the ED.  As part of my medical decision making, I reviewed the following data within the [place] notes reviewed and incorporated, Labs reviewed, notes from prior ED visits and [place]  Controlled Substance Database   ____________________________________________   FINAL CLINICAL IMPRESSION(S) / ED DIAGNOSES  Final diagnoses:  GI bleed due to NSAIDs  Acute blood loss anemia      NEW MEDICATIONS STARTED DURING THIS VISIT:  New Prescriptions   No medications on file     Note:  This document was prepared using Dragon voice recognition software and may include unintentional dictation errors.    Merlyn Lot, MD 05/18/20 1426

## 2020-05-19 ENCOUNTER — Inpatient Hospital Stay
Admit: 2020-05-19 | Discharge: 2020-05-19 | Disposition: A | Discharge status: Home / Self Care | Payer: Self-pay | Attending: Internal Medicine | Admitting: Internal Medicine

## 2020-05-19 ENCOUNTER — Other Ambulatory Visit: Payer: Self-pay

## 2020-05-19 ENCOUNTER — Observation Stay: Payer: Self-pay

## 2020-05-19 DIAGNOSIS — F988 Other specified behavioral and emotional disorders with onset usually occurring in childhood and adolescence: Secondary | ICD-10-CM

## 2020-05-19 DIAGNOSIS — R079 Chest pain, unspecified: Secondary | ICD-10-CM

## 2020-05-19 DIAGNOSIS — D508 Other iron deficiency anemias: Secondary | ICD-10-CM

## 2020-05-19 LAB — CBC
HCT: 26.3 % — ABNORMAL LOW (ref 39.0–52.0)
HCT: 24.7 % — ABNORMAL LOW (ref 39.0–52.0)
HCT: 25.9 % — ABNORMAL LOW (ref 39.0–52.0)
HCT: 28.2 % — ABNORMAL LOW (ref 39.0–52.0)
Hemoglobin: 9.3 g/dL — ABNORMAL LOW (ref 13.0–17.0)
Hemoglobin: 8.6 g/dL — ABNORMAL LOW (ref 13.0–17.0)
Hemoglobin: 9 g/dL — ABNORMAL LOW (ref 13.0–17.0)
Hemoglobin: 9.6 g/dL — ABNORMAL LOW (ref 13.0–17.0)
MCH: 27.9 pg (ref 26.0–34.0)
MCH: 27.5 pg (ref 26.0–34.0)
MCH: 27.5 pg (ref 26.0–34.0)
MCH: 27.7 pg (ref 26.0–34.0)
MCHC: 35.4 g/dL (ref 30.0–36.0)
MCHC: 34.8 g/dL (ref 30.0–36.0)
MCHC: 34.7 g/dL (ref 30.0–36.0)
MCHC: 34 g/dL (ref 30.0–36.0)
MCV: 79 fL — ABNORMAL LOW (ref 80.0–100.0)
MCV: 78.9 fL — ABNORMAL LOW (ref 80.0–100.0)
MCV: 79.2 fL — ABNORMAL LOW (ref 80.0–100.0)
MCV: 81.5 fL (ref 80.0–100.0)
Platelets: 217 10*3/uL (ref 150–400)
Platelets: 209 10*3/uL (ref 150–400)
Platelets: 214 10*3/uL (ref 150–400)
Platelets: 235 10*3/uL (ref 150–400)
RBC: 3.33 MIL/uL — ABNORMAL LOW (ref 4.22–5.81)
RBC: 3.13 MIL/uL — ABNORMAL LOW (ref 4.22–5.81)
RBC: 3.27 MIL/uL — ABNORMAL LOW (ref 4.22–5.81)
RBC: 3.46 MIL/uL — ABNORMAL LOW (ref 4.22–5.81)
RDW: 15.1 % (ref 11.5–15.5)
RDW: 15.3 % (ref 11.5–15.5)
RDW: 15.2 % (ref 11.5–15.5)
RDW: 15.5 % (ref 11.5–15.5)
WBC: 8.4 10*3/uL (ref 4.0–10.5)
WBC: 7.4 10*3/uL (ref 4.0–10.5)
WBC: 7.9 10*3/uL (ref 4.0–10.5)
WBC: 8.5 10*3/uL (ref 4.0–10.5)
nRBC: 0 % (ref 0.0–0.2)
nRBC: 0 % (ref 0.0–0.2)
nRBC: 0.3 % — ABNORMAL HIGH (ref 0.0–0.2)
nRBC: 0 % (ref 0.0–0.2)

## 2020-05-19 LAB — URINE DRUG SCREEN, QUALITATIVE (ARMC ONLY)
Amphetamines, Ur Screen: NOT DETECTED
Barbiturates, Ur Screen: NOT DETECTED
Benzodiazepine, Ur Scrn: NOT DETECTED
Cannabinoid 50 Ng, Ur ~~LOC~~: POSITIVE — AB
Cocaine Metabolite,Ur ~~LOC~~: NOT DETECTED
MDMA (Ecstasy)Ur Screen: NOT DETECTED
Methadone Scn, Ur: NOT DETECTED
Opiate, Ur Screen: NOT DETECTED
Phencyclidine (PCP) Ur S: NOT DETECTED
Tricyclic, Ur Screen: NOT DETECTED

## 2020-05-19 LAB — COMPREHENSIVE METABOLIC PANEL
ALT: 26 U/L (ref 0–44)
AST: 22 U/L (ref 15–41)
Albumin: 4.1 g/dL (ref 3.5–5.0)
Alkaline Phosphatase: 51 U/L (ref 38–126)
Anion gap: 8 (ref 5–15)
BUN: 11 mg/dL (ref 6–20)
CO2: 25 mmol/L (ref 22–32)
Calcium: 9 mg/dL (ref 8.9–10.3)
Chloride: 108 mmol/L (ref 98–111)
Creatinine, Ser: 1.1 mg/dL (ref 0.61–1.24)
GFR calc Af Amer: >60 mL/min (ref >60)
GFR calc non Af Amer: >60 mL/min (ref >60)
Glucose, Bld: 97 mg/dL (ref 70–99)
Potassium: 3.4 mmol/L — ABNORMAL LOW (ref 3.5–5.1)
Sodium: 141 mmol/L (ref 135–145)
Total Bilirubin: 0.8 mg/dL (ref 0.3–1.2)
Total Protein: 6.8 g/dL (ref 6.5–8.1)

## 2020-05-19 LAB — VITAMIN B12: Vitamin B-12: 358 pg/mL (ref 180–914)

## 2020-05-19 LAB — LIPID PANEL
Cholesterol: 217 mg/dL — ABNORMAL HIGH (ref 0–200)
HDL: 41 mg/dL (ref >40)
LDL Cholesterol: 139 mg/dL — ABNORMAL HIGH (ref 0–99)
Total CHOL/HDL Ratio: 5.3 RATIO
Triglycerides: 183 mg/dL — ABNORMAL HIGH (ref <150)
VLDL: 37 mg/dL (ref 0–40)

## 2020-05-19 LAB — ECHOCARDIOGRAM COMPLETE
Height: 71 in
Weight: 2716.8 oz

## 2020-05-19 LAB — TROPONIN I (HIGH SENSITIVITY)
Troponin I (High Sensitivity): 15 ng/L (ref <18)
Troponin I (High Sensitivity): 12 ng/L (ref <18)

## 2020-05-19 LAB — IRON AND TIBC
Iron: 19 ug/dL — ABNORMAL LOW (ref 45–182)
Saturation Ratios: 5 % — ABNORMAL LOW (ref 17.9–39.5)
TIBC: 377 ug/dL (ref 250–450)
UIBC: 358 ug/dL

## 2020-05-19 LAB — FERRITIN: Ferritin: 16 ng/mL — ABNORMAL LOW (ref 24–336)

## 2020-05-19 LAB — FOLATE: Folate: 13.3 ng/mL (ref >5.9)

## 2020-05-19 LAB — HIV ANTIBODY (ROUTINE TESTING W REFLEX): HIV Screen 4th Generation wRfx: NONREACTIVE

## 2020-05-19 LAB — GLUCOSE, CAPILLARY: Glucose-Capillary: 99 mg/dL (ref 70–99)

## 2020-05-19 LAB — MAGNESIUM: Magnesium: 1.9 mg/dL (ref 1.7–2.4)

## 2020-05-19 MED ORDER — SODIUM CHLORIDE 0.9 % IV SOLN: INTRAVENOUS | Status: DC

## 2020-05-19 MED ORDER — PEG 3350-KCL-NA BICARB-NACL 420 G PO SOLR
4000.0000 mL | Freq: Once | ORAL | Status: AC
  Administered 2020-05-19: 4000 mL via ORAL
  Filled 2020-05-19: qty 4000

## 2020-05-19 NOTE — Consult Note (Signed)
Bradley Quinn , MD [address] [place], [place], [place], [address] [address], [place], [place], [place], [address] Phone: [phone]  Fax: [phone]  Consultation  Referring Provider: DR Blaine Hamper    Primary Care Physician:  Delia Chimes, NP (Inactive) Primary Gastroenterologist:  None          Reason for Consultation:     GI bleed   Date of Admission:  05/18/2020 Date of Consultation:  05/19/2020         HPI:   Bradley Quinn is a 44 y.o. male presented to the emergency room with hematemesis, black stool and chest pain with shortness of breath.  He has a history of PTSD GERD depression anxiety.  He states that he developed chest discomfort epigastric pain nausea vomiting on Wednesday.  Multiple episodes where he threw up bloody material.  Subsequently developed hematochezia and melena.  Went on for a couple of days and then decided to come into the hospital when the chest pain on exertion and shortness of breath on exertion got worse.  Last bowel movement was yesterday morning which was black.  He has a history of consuming 50 tablets of Goody's in a week for many years.  Previously not on a PPI.  Denies any weight loss.  Last colonoscopy was 10 years back which she recollects was normal.  No family history of colon cancer or polyps.  Presently still has epigastric discomfort and on and off chest discomfort on exertion.   On admission hemoglobin 7.8 with microcytosis.  Received a blood transfusion. Urine drug screen demonstrated cannabinoids.  This morning hemoglobin 9.3 g with an MCV of 79.  No elevation of BUN/creatinine ratio on admission.  LFTs normal.  Troponin negative.  Some mention of T wave inversions on EKG.  At 6 AM this morning patient had chest pain radiating to left side of neck and axilla.  Given nitroglycerin.  Had blurry vision.  Elevated blood pressure.   Past Medical History:  Diagnosis Date  . ADD (attention deficit disorder)   . Anxiety   . GERD  (gastroesophageal reflux disease)   . Hypertension   . PTSD (post-traumatic stress disorder)     Past Surgical History:  Procedure Laterality Date  . APPENDECTOMY      Prior to Admission medications   Medication Sig Start Date End Date Taking? Authorizing Provider  amphetamine-dextroamphetamine (ADDERALL) 15 MG tablet Take 2 tablets by mouth 2 (two) times daily. 04/01/20  Yes [provider]  FLUoxetine (PROZAC) 20 MG capsule Take 20 mg by mouth daily. 04/30/20  Yes [provider]    Family History  Problem Relation Age of Onset  . Lung cancer Father   . Prostate cancer Father   . Liver cancer Father   . Kidney Stones Sister   . Kidney Stones Brother      Social History   Tobacco Use  . Smoking status: Never Smoker  . Smokeless tobacco: Never Used  Substance Use Topics  . Alcohol use: No  . Drug use: No    Allergies as of 05/18/2020 - Review Complete 05/18/2020  Allergen Reaction Noted  . Iohexol  08/02/2009    Review of Systems:    All systems reviewed and negative except where noted in HPI.   Physical Exam:  Vital signs in last 24 hours: Temp:  [97.9 F (36.6 C)-98.9 F (37.2 C)] 97.9 F (36.6 C) (06/23 0744) Pulse Rate:  [72-94] 76 (06/23 0744) Resp:  [9-20] 17 (  06/23 0744) BP: (141-188)/(89-115) 153/97 (06/23 0744) SpO2:  [98 %-100 %] 100 % (06/23 0744) Weight:  [74.8 kg-77 kg] 77 kg (06/23 0357) Last BM Date: 05/18/20 General:   Pleasant, cooperative in NAD Head:  Normocephalic and atraumatic. Eyes:   No icterus.   Conjunctiva pink. PERRLA. Ears:  Normal auditory acuity. Neck:  Supple; no masses or thyroidomegaly Lungs: Respirations even and unlabored. Lungs clear to auscultation bilaterally.   No wheezes, crackles, or rhonchi.  Heart:  Regular rate and rhythm;  Without murmur, clicks, rubs or gallops Abdomen:  Soft, nondistended, nontender. Normal bowel sounds. No appreciable masses or hepatomegaly.  No rebound or guarding.    Neurologic:  Alert and oriented x3;  grossly normal neurologically. Skin:  Intact without significant lesions or rashes. Cervical Nodes:  No significant cervical adenopathy. Psych:  Alert and cooperative. Normal affect.  LAB RESULTS: Recent Labs    05/18/20 1523 05/18/20 2028 05/19/20 0223  WBC 7.3 7.3 8.4  HGB 7.5* 8.2* 9.3*  HCT 21.6* 24.5* 26.3*  PLT 205 207 217   BMET Recent Labs    05/18/20 1330 05/19/20 0223  NA 139 141  K 3.6 3.4*  CL 102 108  CO2 28 25  GLUCOSE 115* 97  BUN 14 11  CREATININE 1.15 1.10  CALCIUM 9.3 9.0   LFT Recent Labs    05/19/20 0223  PROT 6.8  ALBUMIN 4.1  AST 22  ALT 26  ALKPHOS 51  BILITOT 0.8   PT/INR Recent Labs    05/18/20 2028  LABPROT 13.4  INR 1.1    STUDIES: DG Chest 2 View  Result Date: 05/18/2020 CLINICAL DATA:  Chest pain and shortness of breath for 1 week EXAM: CHEST - 2 VIEW COMPARISON:  09/27/2010 FINDINGS: The heart size and mediastinal contours are within normal limits. Both lungs are clear. The visualized skeletal structures are unremarkable. IMPRESSION: No active cardiopulmonary disease. Electronically Signed   By: Inez Catalina M.D.   On: 05/18/2020 14:06      Impression / Plan:   Bradley Quinn is a 44 y.o. y/o male presented to the emergency room with chest pain hematemesis and melena.  Noted to have hemoglobin of 7.8 g with an MCV of 79.  No elevation of BUN/creatinine ratio.  Some chest pain this morning radiating to left jaw with elevated blood pressure.  History of use of cannabinoids with positive urine drug screen.  I suspect he has had some chronic blood loss and his anemia is not completely acute as he has no elevated BUN/creatinine ratio and he has microcytosis which is new onset.  Chronic blood loss could be from NSAID usage.  Iron studies demonstrate iron deficiency anemia  Plan 1.  Monitor CBC and transfuse 2.  Continue IV PPI 3.  Stop all cannabis use. 4.  Follow-up B12 and folate.   5.   He will need cardiac evaluation and clearance prior to anesthesia for endoscopy evaluation, he does have a history of epigastric pain as well as chest discomfort on exertion.  It may be GI related but could also be from his heart or may be a combination of both.  If cleared we will proceed with EGD and colonoscopy tomorrow morning. 6.  After his endoscopy recommend starting on iron supplementation.  I have discussed alternative options, risks & benefits,  which include, but are not limited to, bleeding, infection, perforation,respiratory complication & drug reaction.  The patient agrees with this plan & written consent will be obtained.  Thank you for involving me in the care of this patient.      LOS: 0 days   Bradley Bellows, MD  05/19/2020, 8:11 AM

## 2020-05-19 NOTE — Progress Notes (Signed)
PROGRESS NOTE    Bradley Quinn  QIO:[id number] DOB: May 23, 1976 DOA: 05/18/2020 PCP: Delia Chimes, NP (Inactive)   Brief Narrative:  On 05/18/2020 by Dr. Ivor Costa Bradley Quinn is a 44 y.o. male with medical history significant of hypertension, GERD, depression, anxiety, PTSD, ADD, who presents with hematemesis, black stool, chest pain and shortness of breath.  Patient states that he has been having intermittent hematemesis for 1 week.  He vomited up 2-3 times of coffee-ground materials.  He has dark tarry stool for about 5 times.  He has epigastric abdominal pain, which is constant, sharp, 7 out of 10 severity, nonradiating.  He had 1 loose stool bowel movement earlier today, but currently no diarrhea.  He also reports chest pain, which is located in left side of chest, sharp, 7 out of 10 severity, radiating to the left axillary area.  It is slightly aggravated by deep breaths. No recent long distant traveling.  Denies fever or chills.  No symptoms of UTI or unilateral weakness.  Patient states that he is taking BC Goody powder at home.  Interim history Admitted with shortness of breath and found to have anemia, received transfusion.  GI consulted. Assessment & Plan   Hematemesis/ melena/ GI bleed/symptomatic anemia/acute blood loss -Patient presented with shortness of breath along with hematemesis and melena which is been ongoing for the past week -Admits to taking Goody powder approximately 8 doses last Wednesday-for hip pain -Gastroenterology consulted and appreciated-planning for EGD and colonoscopy on 05/20/2020 -Hemoglobin down to 7.8 on admission -Continue IV PPI -Received blood transfusion-hemoglobin up to 9 -Continue to monitor CBC  GERD -Continue PPI  Depression -Continue Prozac  Essential hypertension -Patient currently on no medications at home, continue IV hydralazine  Chest pain/abnormal EKG -Patient with atypical chest pain -D-dimer  unremarkable -High-sensitivity troponin trended and unremarkable -Echocardiogram pending -Cardiology consulted and appreciated -Continue pain control  Left hip pain -Patient does state that he fell a while ago -He has been having hip pain that radiates to his groin -hip x-ray was normal -Continue pain control  ADD -Continue Adderall  THC abuse -UDS positive -Discussed cessation  DVT Prophylaxis  SCDs  Code Status: Fi;;  Family Communication: Wife at bedside  Disposition Plan:  Status is: Inpatient  Remains inpatient appropriate because:Ongoing diagnostic testing needed not appropriate for outpatient work up   Dispo: The patient is from: Home              Anticipated d/c is to: Home              Anticipated d/c date is: 2 days              Patient currently is not medically stable to d/c.   Consultants Gastroenterology Cardiology  Procedures  Echocardiogram  Antibiotics   Anti-infectives (From admission, onward)   None      Subjective:   Bradley Quinn seen and examined today.  Patient complains of left hip pain along with the IV site in his left arm bothering him.  States he is also had a headache.  He denies current chest pain.  Has not had any further chest pain since admission.  Denies current shortness of breath.  Denies any nausea or vomiting.  Has not had any further bowel movement since admission.  Objective:   Vitals:   05/19/20 0357 05/19/20 0508 05/19/20 0744 05/19/20 1129  BP: (!) 176/104 (!) 153/95 (!) 153/97 (!) 164/96  Pulse: 92 79 76 79  Resp: 19  17 17  Temp: 98.9 F (37.2 C)  97.9 F (36.6 C) 98.4 F (36.9 C)  TempSrc: Oral  Oral   SpO2: 100%  100% 100%  Weight: 77 kg     Height:        Intake/Output Summary (Last 24 hours) at 05/19/2020 1449 Last data filed at 05/19/2020 0600 Gross per 24 hour  Intake 2339.11 ml  Output 1350 ml  Net 989.11 ml   Filed Weights   05/18/20 1327 05/19/20 0357  Weight: 74.8 kg 77 kg     Exam  General: Well developed, well nourished, NAD, appears stated age  HEENT: NCAT,  mucous membranes moist.   Cardiovascular: S1 S2 auscultated, RRR, no murmur.  Respiratory: Clear to auscultation bilaterally with equal chest rise  Abdomen: Soft, nontender, nondistended, + bowel sounds  Extremities: warm dry without cyanosis clubbing or edema  Neuro: AAOx3, nonfocal  Psych: Normal affect and demeanor with intact judgement and insight   Data Reviewed: I have personally reviewed following labs and imaging studies  CBC: Recent Labs  Lab 05/18/20 1330 05/18/20 1523 05/18/20 2028 05/19/20 0223 05/19/20 0919  WBC 9.3 7.3 7.3 8.4 7.4  HGB 7.8* 7.5* 8.2* 9.3* 8.6*  HCT 23.5* 21.6* 24.5* 26.3* 24.7*  MCV 81.0 79.7* 81.7 79.0* 78.9*  PLT 238 205 207 217 384   Basic Metabolic Panel: Recent Labs  Lab 05/18/20 1330 05/19/20 0223  NA 139 141  K 3.6 3.4*  CL 102 108  CO2 28 25  GLUCOSE 115* 97  BUN 14 11  CREATININE 1.15 1.10  CALCIUM 9.3 9.0  MG  --  1.9   GFR: Estimated Creatinine Clearance: 92.2 mL/min (by C-G formula based on SCr of 1.1 mg/dL). Liver Function Tests: Recent Labs  Lab 05/19/20 0223  AST 22  ALT 26  ALKPHOS 51  BILITOT 0.8  PROT 6.8  ALBUMIN 4.1   No results for input(s): LIPASE, AMYLASE in the last 168 hours. No results for input(s): AMMONIA in the last 168 hours. Coagulation Profile: Recent Labs  Lab 05/18/20 2028  INR 1.1   Cardiac Enzymes: No results for input(s): CKTOTAL, CKMB, CKMBINDEX, TROPONINI in the last 168 hours. BNP (last 3 results) No results for input(s): PROBNP in the last 8760 hours. HbA1C: No results for input(s): HGBA1C in the last 72 hours. CBG: Recent Labs  Lab 05/19/20 0746  GLUCAP 99   Lipid Profile: Recent Labs    05/19/20 0223  CHOL 217*  HDL 41  LDLCALC 139*  TRIG 183*  CHOLHDL 5.3   Thyroid Function Tests: No results for input(s): TSH, T4TOTAL, FREET4, T3FREE, THYROIDAB in the last 72  hours. Anemia Panel: Recent Labs    05/19/20 0919  VITAMINB12 358  FOLATE 13.3  FERRITIN 16*  TIBC 377  IRON 19*   Urine analysis:    Component Value Date/Time   COLORURINE YELLOW 07/12/2012 0240   APPEARANCEUR CLEAR 07/12/2012 0240   LABSPEC 1.018 07/12/2012 0240   PHURINE 6.0 07/12/2012 0240   GLUCOSEU NEGATIVE 07/12/2012 0240   HGBUR NEGATIVE 07/12/2012 0240   BILIRUBINUR NEGATIVE 07/12/2012 0240   KETONESUR NEGATIVE 07/12/2012 0240   PROTEINUR NEGATIVE 07/12/2012 0240   UROBILINOGEN 0.2 07/12/2012 0240   NITRITE NEGATIVE 07/12/2012 0240   LEUKOCYTESUR NEGATIVE 07/12/2012 0240   Sepsis Labs: @LABRCNTIP (procalcitonin:4,lacticidven:4)  ) Recent Results (from the past 240 hour(s))  SARS Coronavirus 2 by RT PCR (hospital order, performed in [facility] hospital lab) Nasopharyngeal Nasopharyngeal Swab     Status: None  Collection Time: 05/18/20  3:23 PM   Specimen: Nasopharyngeal Swab  Result Value Ref Range Status   SARS Coronavirus 2 NEGATIVE NEGATIVE Final    Comment: (NOTE) SARS-CoV-2 target nucleic acids are NOT DETECTED.  The SARS-CoV-2 RNA is generally detectable in upper and lower respiratory specimens during the acute phase of infection. The lowest concentration of SARS-CoV-2 viral copies this assay can detect is 250 copies / mL. A negative result does not preclude SARS-CoV-2 infection and should not be used as the sole basis for treatment or other patient management decisions.  A negative result may occur with improper specimen collection / handling, submission of specimen other than nasopharyngeal swab, presence of viral mutation(s) within the areas targeted by this assay, and inadequate number of viral copies (<250 copies / mL). A negative result must be combined with clinical observations, patient history, and epidemiological information.  Fact Sheet for Patients:   StrictlyIdeas.no  Fact Sheet for Healthcare  Providers: BankingDealers.co.za  This test is not yet approved or  cleared by the [place] FDA and has been authorized for detection and/or diagnosis of SARS-CoV-2 by FDA under an Emergency Use Authorization (EUA).  This EUA will remain in effect (meaning this test can be used) for the duration of the COVID-19 declaration under Section 564(b)(1) of the Act, 21 U.S.C. section 360bbb-3(b)(1), unless the authorization is terminated or revoked sooner.  Performed at [facility], [address], [place], [place] [address]       Radiology Studies: DG Chest 2 View  Result Date: 05/18/2020 CLINICAL DATA:  Chest pain and shortness of breath for 1 week EXAM: CHEST - 2 VIEW COMPARISON:  09/27/2010 FINDINGS: The heart size and mediastinal contours are within normal limits. Both lungs are clear. The visualized skeletal structures are unremarkable. IMPRESSION: No active cardiopulmonary disease. Electronically Signed   By: Inez Catalina M.D.   On: 05/18/2020 14:06   DG HIP UNILAT WITH PELVIS 2-3 VIEWS LEFT  Result Date: 05/19/2020 CLINICAL DATA:  Chronic left groin pain which may be due to remote fall. Initial encounter. EXAM: DG HIP (WITH OR WITHOUT PELVIS) 2-3V LEFT COMPARISON:  CT abdomen and pelvis 09/12/2010. FINDINGS: There is no evidence of hip fracture or dislocation. There is no evidence of arthropathy or other focal bone abnormality. IMPRESSION: Normal exam. Electronically Signed   By: Inge Rise M.D.   On: 05/19/2020 09:25     Scheduled Meds: . amphetamine-dextroamphetamine  30 mg Oral BID  . FLUoxetine  20 mg Oral Daily  . [START ON 05/22/2020] pantoprazole  40 mg Intravenous Q12H  . polyethylene glycol-electrolytes  4,000 mL Oral Once   Continuous Infusions: . sodium chloride    . sodium chloride 100 mL/hr at 05/19/20 1108  . sodium chloride    . pantoprozole (PROTONIX) infusion 8 mg/hr (05/19/20 1105)     LOS: 0 days   Time Spent in  minutes   45 minutes  Kanoe Wanner D.O. on 05/19/2020 at 2:49 PM  Between 7am to 7pm - Please see pager noted on [email or web address]  After 7pm go to [email or web address]  And look for the night coverage person covering for me after hours  [facility] Office  [phone]

## 2020-05-19 NOTE — Progress Notes (Signed)
Patient had episode of chest pain radiating to left side of neck and axilla. Given nitro x1. Morphine x1. MD notified. C/o of blurred vision. Neuro assessment intact. BP elevated medicated as needed. Wife called to bedside to help manage anxiety. Patient resting comfortably. No further complaints of pain

## 2020-05-19 NOTE — Progress Notes (Signed)
*  PRELIMINARY RESULTS* Echocardiogram 2D Echocardiogram has been performed.  Bradley Quinn 05/19/2020, 2:31 PM

## 2020-05-20 ENCOUNTER — Encounter: Payer: Self-pay | Admitting: Anesthesiology

## 2020-05-20 ENCOUNTER — Encounter
Admission: EM | Disposition: A | Discharge status: Home / Self Care | Payer: Self-pay | Source: Home / Self Care | Attending: Internal Medicine

## 2020-05-20 HISTORY — PX: LEFT HEART CATH AND CORONARY ANGIOGRAPHY: CATH118249

## 2020-05-20 LAB — CBC
HCT: 25 % — ABNORMAL LOW (ref 39.0–52.0)
HCT: 25.2 % — ABNORMAL LOW (ref 39.0–52.0)
Hemoglobin: 8.3 g/dL — ABNORMAL LOW (ref 13.0–17.0)
Hemoglobin: 8.3 g/dL — ABNORMAL LOW (ref 13.0–17.0)
MCH: 27.3 pg (ref 26.0–34.0)
MCH: 27 pg (ref 26.0–34.0)
MCHC: 33.2 g/dL (ref 30.0–36.0)
MCHC: 32.9 g/dL (ref 30.0–36.0)
MCV: 82.2 fL (ref 80.0–100.0)
MCV: 82.1 fL (ref 80.0–100.0)
Platelets: 221 10*3/uL (ref 150–400)
Platelets: 214 10*3/uL (ref 150–400)
RBC: 3.04 MIL/uL — ABNORMAL LOW (ref 4.22–5.81)
RBC: 3.07 MIL/uL — ABNORMAL LOW (ref 4.22–5.81)
RDW: 15.3 % (ref 11.5–15.5)
RDW: 15.4 % (ref 11.5–15.5)
WBC: 7 10*3/uL (ref 4.0–10.5)
WBC: 7 10*3/uL (ref 4.0–10.5)
nRBC: 0.3 % — ABNORMAL HIGH (ref 0.0–0.2)
nRBC: 0.3 % — ABNORMAL HIGH (ref 0.0–0.2)

## 2020-05-20 LAB — BASIC METABOLIC PANEL
Anion gap: 6 (ref 5–15)
BUN: 7 mg/dL (ref 6–20)
CO2: 26 mmol/L (ref 22–32)
Calcium: 8.6 mg/dL — ABNORMAL LOW (ref 8.9–10.3)
Chloride: 112 mmol/L — ABNORMAL HIGH (ref 98–111)
Creatinine, Ser: 1.04 mg/dL (ref 0.61–1.24)
GFR calc Af Amer: >60 mL/min (ref >60)
GFR calc non Af Amer: >60 mL/min (ref >60)
Glucose, Bld: 110 mg/dL — ABNORMAL HIGH (ref 70–99)
Potassium: 3.5 mmol/L (ref 3.5–5.1)
Sodium: 144 mmol/L (ref 135–145)

## 2020-05-20 LAB — GLUCOSE, CAPILLARY: Glucose-Capillary: 101 mg/dL — ABNORMAL HIGH (ref 70–99)

## 2020-05-20 SURGERY — LEFT HEART CATH AND CORONARY ANGIOGRAPHY
Anesthesia: Moderate Sedation

## 2020-05-20 SURGERY — ESOPHAGOGASTRODUODENOSCOPY (EGD) WITH PROPOFOL
Anesthesia: General

## 2020-05-20 MED ORDER — METOPROLOL TARTRATE 25 MG PO TABS
25.0000 mg | ORAL_TABLET | Freq: Two times a day (BID) | ORAL | Status: DC
  Administered 2020-05-20 – 2020-05-23 (×7): 25 mg via ORAL
  Filled 2020-05-20 (×7): qty 1

## 2020-05-20 MED ORDER — MIDAZOLAM HCL 2 MG/2ML IJ SOLN
INTRAMUSCULAR | Status: DC | PRN
  Administered 2020-05-20: 1 mg via INTRAVENOUS

## 2020-05-20 MED ORDER — METHYLPREDNISOLONE SODIUM SUCC 125 MG IJ SOLR
INTRAMUSCULAR | Status: DC | PRN
  Administered 2020-05-20: 125 mg via INTRAVENOUS

## 2020-05-20 MED ORDER — ATORVASTATIN CALCIUM 80 MG PO TABS
80.0000 mg | ORAL_TABLET | Freq: Every day | ORAL | Status: DC
  Administered 2020-05-20 – 2020-05-23 (×4): 80 mg via ORAL
  Filled 2020-05-20 (×4): qty 1

## 2020-05-20 MED ORDER — SODIUM CHLORIDE 0.9 % IV SOLN: 250.0000 mL | INTRAVENOUS | Status: DC | PRN

## 2020-05-20 MED ORDER — ASPIRIN 81 MG PO CHEW
CHEWABLE_TABLET | ORAL | Status: AC
  Filled 2020-05-20: qty 1

## 2020-05-20 MED ORDER — SODIUM CHLORIDE 0.9% FLUSH
3.0000 mL | Freq: Two times a day (BID) | INTRAVENOUS | Status: DC
  Administered 2020-05-20 – 2020-05-21 (×2): 3 mL via INTRAVENOUS

## 2020-05-20 MED ORDER — ASPIRIN 81 MG PO CHEW: 81.0000 mg | CHEWABLE_TABLET | ORAL | Status: DC | PRN

## 2020-05-20 MED ORDER — ATORVASTATIN CALCIUM 80 MG PO TABS: 80.0000 mg | ORAL_TABLET | Freq: Every day | ORAL | Status: DC

## 2020-05-20 MED ORDER — DIPHENHYDRAMINE HCL 50 MG/ML IJ SOLN
INTRAMUSCULAR | Status: AC
  Filled 2020-05-20: qty 1

## 2020-05-20 MED ORDER — SODIUM CHLORIDE 0.9% FLUSH: 3.0000 mL | INTRAVENOUS | Status: DC | PRN

## 2020-05-20 MED ORDER — SODIUM CHLORIDE 0.9 % WEIGHT BASED INFUSION: 1.0000 mL/kg/h | INTRAVENOUS | Status: DC

## 2020-05-20 MED ORDER — SODIUM CHLORIDE 0.9% FLUSH
3.0000 mL | Freq: Two times a day (BID) | INTRAVENOUS | Status: DC
  Administered 2020-05-21 – 2020-05-22 (×2): 3 mL via INTRAVENOUS

## 2020-05-20 MED ORDER — HEPARIN (PORCINE) IN NACL 1000-0.9 UT/500ML-% IV SOLN
INTRAVENOUS | Status: DC | PRN
  Administered 2020-05-20: 500 mL

## 2020-05-20 MED ORDER — ISOSORBIDE MONONITRATE ER 30 MG PO TB24
30.0000 mg | ORAL_TABLET | Freq: Every day | ORAL | Status: DC
  Administered 2020-05-20 – 2020-05-23 (×4): 30 mg via ORAL
  Filled 2020-05-20 (×4): qty 1

## 2020-05-20 MED ORDER — IOHEXOL 300 MG/ML  SOLN
INTRAMUSCULAR | Status: DC | PRN
  Administered 2020-05-20: 115 mL

## 2020-05-20 MED ORDER — DIPHENHYDRAMINE HCL 50 MG/ML IJ SOLN
INTRAMUSCULAR | Status: DC | PRN
  Administered 2020-05-20: 25 mg via INTRAVENOUS

## 2020-05-20 MED ORDER — ACETAMINOPHEN 325 MG PO TABS
650.0000 mg | ORAL_TABLET | ORAL | Status: DC | PRN
  Filled 2020-05-20: qty 2

## 2020-05-20 MED ORDER — LIDOCAINE HCL (PF) 1 % IJ SOLN
INTRAMUSCULAR | Status: DC | PRN
  Administered 2020-05-20: 15 mL

## 2020-05-20 MED ORDER — FENTANYL CITRATE (PF) 100 MCG/2ML IJ SOLN
INTRAMUSCULAR | Status: DC | PRN
  Administered 2020-05-20: 50 ug via INTRAVENOUS

## 2020-05-20 MED ORDER — FENTANYL CITRATE (PF) 100 MCG/2ML IJ SOLN
INTRAMUSCULAR | Status: AC
  Filled 2020-05-20: qty 2

## 2020-05-20 MED ORDER — SODIUM CHLORIDE 0.9 % WEIGHT BASED INFUSION: 1.0000 mL/kg/h | INTRAVENOUS | Status: AC

## 2020-05-20 MED ORDER — LIDOCAINE HCL (PF) 1 % IJ SOLN
INTRAMUSCULAR | Status: AC
  Filled 2020-05-20: qty 30

## 2020-05-20 MED ORDER — ASPIRIN 81 MG PO CHEW: 81.0000 mg | CHEWABLE_TABLET | ORAL | Status: DC

## 2020-05-20 MED ORDER — LABETALOL HCL 5 MG/ML IV SOLN: 10.0000 mg | INTRAVENOUS | Status: AC | PRN

## 2020-05-20 MED ORDER — SODIUM CHLORIDE 0.9 % WEIGHT BASED INFUSION: 3.0000 mL/kg/h | INTRAVENOUS | Status: DC

## 2020-05-20 MED ORDER — HYDRALAZINE HCL 20 MG/ML IJ SOLN: 10.0000 mg | INTRAMUSCULAR | Status: AC | PRN

## 2020-05-20 MED ORDER — HEPARIN (PORCINE) IN NACL 1000-0.9 UT/500ML-% IV SOLN
INTRAVENOUS | Status: AC
  Filled 2020-05-20: qty 1000

## 2020-05-20 MED ORDER — METHYLPREDNISOLONE SODIUM SUCC 125 MG IJ SOLR
INTRAMUSCULAR | Status: AC
  Filled 2020-05-20: qty 2

## 2020-05-20 MED ORDER — ONDANSETRON HCL 4 MG/2ML IJ SOLN: 4.0000 mg | Freq: Four times a day (QID) | INTRAMUSCULAR | Status: DC | PRN

## 2020-05-20 MED ORDER — MIDAZOLAM HCL 2 MG/2ML IJ SOLN
INTRAMUSCULAR | Status: AC
  Filled 2020-05-20: qty 2

## 2020-05-20 SURGICAL SUPPLY — 8 items
CATH INFINITI 5FR JL4 (CATHETERS) ×3 IMPLANT
CATH INFINITI JR4 5F (CATHETERS) ×3 IMPLANT
DEVICE CLOSURE MYNXGRIP 5F (Vascular Products) ×3 IMPLANT
KIT MANI 3VAL PERCEP (MISCELLANEOUS) ×3 IMPLANT
NEEDLE PERC 18GX7CM (NEEDLE) ×3 IMPLANT
PACK CARDIAC CATH (CUSTOM PROCEDURE TRAY) ×3 IMPLANT
SHEATH AVANTI 5FR X 11CM (SHEATH) ×3 IMPLANT
WIRE GUIDERIGHT .035X150 (WIRE) ×3 IMPLANT

## 2020-05-20 NOTE — Consult Note (Signed)
CARDIOLOGY CONSULT NOTE               Patient ID: Bradley Quinn MRN: [id number] DOB/AGE: January 29, 1976 44 y.o.  Admit date: 05/18/2020 Referring Physician Dr Ivor Costa hospitalist Primary Physician Delia Chimes, NP Primary Cardiologist none Reason for Consultation preoperative clearance angina chest pain shortness of breath  HPI: 44 year old male history of hypertension GERD PTSD ADD shortness of breath with recent chest pain symptoms previous smoking.  Patient presented with chest pain symptoms but also nausea vomiting coffee-ground was found to be anemic with GI bleeding now is preop for GI procedure to evaluate for source of bleeding.  The patient also complained of chest discomfort on and off for the last several weeks has gotten progressively worse she also describes chest pain symptoms some radiation to his arm dyspnea he is not had any significant cardiac work-up and evaluation for now on telemetry with recurrent episodes he had an episode yesterday requiring nitroglycerin.  Review of systems complete and found to be negative unless listed above     Past Medical History:  Diagnosis Date  . ADD (attention deficit disorder)   . Anxiety   . GERD (gastroesophageal reflux disease)   . Hypertension   . PTSD (post-traumatic stress disorder)     Past Surgical History:  Procedure Laterality Date  . APPENDECTOMY      Medications Prior to Admission  Medication Sig Dispense Refill Last Dose  . amphetamine-dextroamphetamine (ADDERALL) 15 MG tablet Take 2 tablets by mouth 2 (two) times daily.   05/17/2020 at 2100  . FLUoxetine (PROZAC) 20 MG capsule Take 20 mg by mouth daily.   05/17/2020 at 2100   Social History   Socioeconomic History  . Marital status: Married    Spouse name: Not on file  . Number of children: Not on file  . Years of education: Not on file  . Highest education level: Not on file  Occupational History  . Not on file  Tobacco Use  . Smoking status: Never  Smoker  . Smokeless tobacco: Never Used  Substance and Sexual Activity  . Alcohol use: No  . Drug use: No  . Sexual activity: Not on file  Other Topics Concern  . Not on file  Social History Narrative  . Not on file   Social Determinants of Health   Financial Resource Strain:   . Difficulty of Paying Living Expenses:   Food Insecurity:   . Worried About Charity fundraiser in the Last Year:   . Arboriculturist in the Last Year:   Transportation Quinn:   . Film/video editor (Medical):   Marland Kitchen Lack of Transportation (Non-Medical):   Physical Activity:   . Days of Exercise per Week:   . Minutes of Exercise per Session:   Stress:   . Feeling of Stress :   Social Connections:   . Frequency of Communication with Friends and Family:   . Frequency of Social Gatherings with Friends and Family:   . Attends Religious Services:   . Active Member of Clubs or Organizations:   . Attends Archivist Meetings:   Marland Kitchen Marital Status:   Intimate Partner Violence:   . Fear of Current or Ex-Partner:   . Emotionally Abused:   Marland Kitchen Physically Abused:   . Sexually Abused:     Family History  Problem Relation Age of Onset  . Lung cancer Father   . Prostate cancer Father   . Liver cancer Father   .  Kidney Stones Sister   . Kidney Stones Brother       Review of systems complete and found to be negative unless listed above      PHYSICAL EXAM  General: Well developed, well nourished, in no acute distress HEENT:  Normocephalic and atramatic Neck:  No JVD.  Lungs: Clear bilaterally to auscultation and percussion. Heart: HRRR . Normal S1 and S2 without gallops or murmurs.  Abdomen: Bowel sounds are positive, abdomen soft and non-tender  Msk:  Back normal, normal gait. Normal strength and tone for age. Extremities: No clubbing, cyanosis or edema.   Neuro: Alert and oriented X 3. Psych:  Good affect, responds appropriately  Labs:   Lab Results  Component Value Date   WBC 7.0  05/20/2020   HGB 8.3 (L) 05/20/2020   HCT 25.0 (L) 05/20/2020   MCV 82.2 05/20/2020   PLT 221 05/20/2020    Recent Labs  Lab 05/19/20 0223 05/19/20 0223 05/20/20 0314  NA 141   < > 144  K 3.4*   < > 3.5  CL 108   < > 112*  CO2 25   < > 26  BUN 11   < > 7  CREATININE 1.10   < > 1.04  CALCIUM 9.0   < > 8.6*  PROT 6.8  --   --   BILITOT 0.8  --   --   ALKPHOS 51  --   --   ALT 26  --   --   AST 22  --   --   GLUCOSE 97   < > 110*   < > = values in this interval not displayed.   No results found for: CKTOTAL, CKMB, CKMBINDEX, TROPONINI  Lab Results  Component Value Date   CHOL 217 (H) 05/19/2020   Lab Results  Component Value Date   HDL 41 05/19/2020   Lab Results  Component Value Date   LDLCALC 139 (H) 05/19/2020   Lab Results  Component Value Date   TRIG 183 (H) 05/19/2020   Lab Results  Component Value Date   CHOLHDL 5.3 05/19/2020   No results found for: LDLDIRECT    Radiology: DG Chest 2 View  Result Date: 05/18/2020 CLINICAL DATA:  Chest pain and shortness of breath for 1 week EXAM: CHEST - 2 VIEW COMPARISON:  09/27/2010 FINDINGS: The heart size and mediastinal contours are within normal limits. Both lungs are clear. The visualized skeletal structures are unremarkable. IMPRESSION: No active cardiopulmonary disease. Electronically Signed   By: Inez Catalina M.D.   On: 05/18/2020 14:06   ECHOCARDIOGRAM COMPLETE  Result Date: 05/19/2020    ECHOCARDIOGRAM REPORT   Patient Name:   Bradley Quinn Date of Exam: 05/19/2020 Medical Rec #:  [id number]      Height:       71.0 in Accession #:    [id number]     Weight:       169.8 lb Date of Birth:  02/15/1976      BSA:          1.967 m Patient Age:    85 years       BP:           164/96 mmHg Patient Gender: M              HR:           79 bpm. Exam Location:  [facility] Procedure: 2D Echo, Color Doppler and Cardiac Doppler Indications:  Preoperative clearance  History:         Patient has no prior history of Echocardiogram  examinations.                  Risk Factors:Hypertension. Anxiety, PTSD.  Sonographer:     Sherrie Sport RDCS (AE) Referring Phys:  [id number] [facility] MIKHAIL Diagnosing Phys: Yolonda Kida MD IMPRESSIONS  1. Left ventricular ejection fraction, by estimation, is >75%. The left ventricle has hyperdynamic function. The left ventricle has no regional wall motion abnormalities. Left ventricular diastolic parameters were normal.  2. Right ventricular systolic function is normal. The right ventricular size is normal. There is normal pulmonary artery systolic pressure.  3. The mitral valve is normal in structure. Trivial mitral valve regurgitation.  4. The aortic valve is normal in structure. Aortic valve regurgitation is not visualized. FINDINGS  Left Ventricle: Left ventricular ejection fraction, by estimation, is >75%. The left ventricle has hyperdynamic function. The left ventricle has no regional wall motion abnormalities. The left ventricular internal cavity size was normal in size. There is borderline left ventricular hypertrophy. Left ventricular diastolic parameters were normal. Right Ventricle: The right ventricular size is normal. No increase in right ventricular wall thickness. Right ventricular systolic function is normal. There is normal pulmonary artery systolic pressure. The tricuspid regurgitant velocity is 1.98 m/s, and  with an assumed right atrial pressure of 10 mmHg, the estimated right ventricular systolic pressure is 93.8 mmHg. Left Atrium: Left atrial size was normal in size. Right Atrium: Right atrial size was normal in size. Pericardium: There is no evidence of pericardial effusion. Mitral Valve: The mitral valve is normal in structure. Trivial mitral valve regurgitation. Tricuspid Valve: The tricuspid valve is normal in structure. Tricuspid valve regurgitation is trivial. Aortic Valve: The aortic valve is normal in structure. Aortic valve regurgitation is not visualized. Aortic valve mean gradient  measures 3.0 mmHg. Aortic valve peak gradient measures 5.3 mmHg. Aortic valve area, by VTI measures 3.29 cm. Pulmonic Valve: The pulmonic valve was normal in structure. Pulmonic valve regurgitation is not visualized. Aorta: The aortic arch was not well visualized. IAS/Shunts: No atrial level shunt detected by color flow Doppler.  LEFT VENTRICLE PLAX 2D LVIDd:         4.56 cm  Diastology LVIDs:         2.47 cm  LV e' lateral:   9.46 cm/s LV PW:         1.18 cm  LV E/e' lateral: 9.7 LV IVS:        1.38 cm  LV e' medial:    2.83 cm/s LVOT diam:     2.30 cm  LV E/e' medial:  32.3 LV SV:         66 LV SV Index:   34 LVOT Area:     4.15 cm  RIGHT VENTRICLE RV S prime:     15.30 cm/s TAPSE (M-mode): 4.2 cm LEFT ATRIUM             Index       RIGHT ATRIUM           Index LA diam:        4.20 cm 2.14 cm/m  RA Area:     17.00 cm LA Vol (A2C):   77.3 ml 39.30 ml/m RA Volume:   47.50 ml  24.15 ml/m LA Vol (A4C):   47.1 ml 23.95 ml/m LA Biplane Vol: 64.4 ml 32.74 ml/m  AORTIC VALVE  PULMONIC VALVE AV Area (Vmax):    3.24 cm    PV Vmax:        0.59 m/s AV Area (Vmean):   3.49 cm    PV Peak grad:   1.4 mmHg AV Area (VTI):     3.29 cm    RVOT Peak grad: 2 mmHg AV Vmax:           115.50 cm/s AV Vmean:          75.650 cm/s AV VTI:            0.202 m AV Peak Grad:      5.3 mmHg AV Mean Grad:      3.0 mmHg LVOT Vmax:         90.10 cm/s LVOT Vmean:        63.600 cm/s LVOT VTI:          0.160 m LVOT/AV VTI ratio: 0.79  AORTA Ao Root diam: 3.30 cm MITRAL VALVE               TRICUSPID VALVE MV Area (PHT): 6.54 cm    TR Peak grad:   15.7 mmHg MV Decel Time: 116 msec    TR Vmax:        198.00 cm/s MV E velocity: 91.30 cm/s MV A velocity: 75.00 cm/s  SHUNTS MV E/A ratio:  1.22        Systemic VTI:  0.16 m                            Systemic Diam: 2.30 cm Brittani Purdum Prince Rome MD Electronically signed by Yolonda Kida MD Signature Date/Time: 05/19/2020/4:17:15 PM    Final    DG HIP UNILAT WITH PELVIS 2-3 VIEWS  LEFT  Result Date: 05/19/2020 CLINICAL DATA:  Chronic left groin pain which may be due to remote fall. Initial encounter. EXAM: DG HIP (WITH OR WITHOUT PELVIS) 2-3V LEFT COMPARISON:  CT abdomen and pelvis 09/12/2010. FINDINGS: There is no evidence of hip fracture or dislocation. There is no evidence of arthropathy or other focal bone abnormality. IMPRESSION: Normal exam. Electronically Signed   By: Inge Rise M.D.   On: 05/19/2020 09:25    EKG: Normal sinus rhythm nonspecific ST-T wave changes  ASSESSMENT AND PLAN:  Unstable angina HTN Abnormal Ekg POC for GI bleeding Anemia GERD Anxiety / Depression PTSD . Plan Agree with admit and evaluation on telemetry Agree with follow-up EKGs and troponins Preop for GERD procedure Continue IV Protonix therapy for reflux type symptoms IV hydralazine as needed for hypertension Transfuse to maintain H&H of at least 8 and 24 Would recommend proceeding with cardiac cath as the most efficient definitive weight to cardiac clearance for GI procedure If cardiac cath negative then the patient will be cleared for GI procedure later today or tomorrow  Signed: Yolonda Kida MD 05/20/2020, 7:16 AM

## 2020-05-20 NOTE — Progress Notes (Addendum)
Procedures cancelled as he has gone for cardiac cath. We reschedule based on cath results and cardiology recs  Dr [name] MD,MRCP [facility]) Gastroenterology/Hepatology Pager: [phone]

## 2020-05-20 NOTE — Progress Notes (Signed)
Pt leaving unit for cardiac cath.

## 2020-05-20 NOTE — Progress Notes (Signed)
Dr. Clayborn Bigness spoke extensively with pt. Wife re: cath results. Wife verbalized complete understanding of conversation.

## 2020-05-20 NOTE — Progress Notes (Signed)
Patient and patient's wife, Sharyn Lull, verbalized understanding of planned procedure EGD that patient is to be NPO after midnight. Educated patient to take all of prep as ordered patient verbalized will intake. Will monitor patient's BM closely.

## 2020-05-20 NOTE — Progress Notes (Signed)
Consent for cardiac cath obtained & placed in pts chart.

## 2020-05-20 NOTE — Progress Notes (Signed)
[facility] Cardiology    SUBJECTIVE: Status post cardiac cath for anginal symptoms and preoperative clearance for GI procedure.  Cath showed significant distal vessel disease in the circumflex and right coronary artery these are small vessels but still may be amenable to percutaneous intervention with stents but recommend medical therapy for now.  Patient denies any chest pain currently and has been placed on medical therapy and the patient should be preop for GI procedure tomorrow.  We have held aspirin for now until GI says is okay to resume   Vitals:   05/20/20 1445 05/20/20 1500 05/20/20 1515 05/20/20 1551  BP: 138/87 132/84 136/84 (!) 130/94  Pulse: 75 81 75 78  Resp: 15 16 17 18   Temp:      TempSrc:      SpO2: 97% 97% 97% 100%  Weight:      Height:         Intake/Output Summary (Last 24 hours) at 05/20/2020 1651 Last data filed at 05/20/2020 0800 Gross per 24 hour  Intake 2753.52 ml  Output 600 ml  Net 2153.52 ml      PHYSICAL EXAM  General: Well developed, well nourished, in no acute distress HEENT:  Normocephalic and atramatic Neck:  No JVD.  Lungs: Clear bilaterally to auscultation and percussion. Heart: HRRR . Normal S1 and S2 without gallops or murmurs.  Abdomen: Bowel sounds are positive, abdomen soft and non-tender  Msk:  Back normal, normal gait. Normal strength and tone for age. Extremities: No clubbing, cyanosis or edema.   Neuro: Alert and oriented X 3. Psych:  Good affect, responds appropriately   LABS: Basic Metabolic Panel: Recent Labs    05/19/20 0223 05/20/20 0314  NA 141 144  K 3.4* 3.5  CL 108 112*  CO2 25 26  GLUCOSE 97 110*  BUN 11 7  CREATININE 1.10 1.04  CALCIUM 9.0 8.6*  MG 1.9  --    Liver Function Tests: Recent Labs    05/19/20 0223  AST 22  ALT 26  ALKPHOS 51  BILITOT 0.8  PROT 6.8  ALBUMIN 4.1   No results for input(s): LIPASE, AMYLASE in the last 72 hours. CBC: Recent Labs    05/20/20 0314 05/20/20 0904  WBC 7.0 7.0    HGB 8.3* 8.3*  HCT 25.0* 25.2*  MCV 82.2 82.1  PLT 221 214   Cardiac Enzymes: No results for input(s): CKTOTAL, CKMB, CKMBINDEX, TROPONINI in the last 72 hours. BNP: Invalid input(s): POCBNP D-Dimer: No results for input(s): DDIMER in the last 72 hours. Hemoglobin A1C: No results for input(s): HGBA1C in the last 72 hours. Fasting Lipid Panel: Recent Labs    05/19/20 0223  CHOL 217*  HDL 41  LDLCALC 139*  TRIG 183*  CHOLHDL 5.3   Thyroid Function Tests: No results for input(s): TSH, T4TOTAL, T3FREE, THYROIDAB in the last 72 hours.  Invalid input(s): FREET3 Anemia Panel: Recent Labs    05/19/20 0919  VITAMINB12 358  FOLATE 13.3  FERRITIN 16*  TIBC 377  IRON 19*    CARDIAC CATHETERIZATION  Result Date: 05/20/2020  Dist Cx-1 lesion is 90% stenosed.  Dist Cx-2 lesion is 90% stenosed.  3rd Mrg lesion is 75% stenosed.  RPAV-1 lesion is 70% stenosed.  RPAV-2 lesion is 90% stenosed.  Mid RCA lesion is 30% stenosed.  Prox RCA lesion is 25% stenosed.    ECHOCARDIOGRAM COMPLETE  Result Date: 05/19/2020    ECHOCARDIOGRAM REPORT   Patient Name:   Bradley Quinn Date of Exam:  05/19/2020 Medical Rec #:  [id number]      Height:       71.0 in Accession #:    [id number]     Weight:       169.8 lb Date of Birth:  1976/02/06      BSA:          1.967 m Patient Age:    44 years       BP:           164/96 mmHg Patient Gender: M              HR:           79 bpm. Exam Location:  [facility] Procedure: 2D Echo, Color Doppler and Cardiac Doppler Indications:     Preoperative clearance  History:         Patient has no prior history of Echocardiogram examinations.                  Risk Factors:Hypertension. Anxiety, PTSD.  Sonographer:     Sherrie Sport RDCS (AE) Referring Phys:  [id number] [facility] MIKHAIL Diagnosing Phys: Yolonda Kida MD IMPRESSIONS  1. Left ventricular ejection fraction, by estimation, is >75%. The left ventricle has hyperdynamic function. The left ventricle has no regional wall  motion abnormalities. Left ventricular diastolic parameters were normal.  2. Right ventricular systolic function is normal. The right ventricular size is normal. There is normal pulmonary artery systolic pressure.  3. The mitral valve is normal in structure. Trivial mitral valve regurgitation.  4. The aortic valve is normal in structure. Aortic valve regurgitation is not visualized. FINDINGS  Left Ventricle: Left ventricular ejection fraction, by estimation, is >75%. The left ventricle has hyperdynamic function. The left ventricle has no regional wall motion abnormalities. The left ventricular internal cavity size was normal in size. There is borderline left ventricular hypertrophy. Left ventricular diastolic parameters were normal. Right Ventricle: The right ventricular size is normal. No increase in right ventricular wall thickness. Right ventricular systolic function is normal. There is normal pulmonary artery systolic pressure. The tricuspid regurgitant velocity is 1.98 m/s, and  with an assumed right atrial pressure of 10 mmHg, the estimated right ventricular systolic pressure is 34.2 mmHg. Left Atrium: Left atrial size was normal in size. Right Atrium: Right atrial size was normal in size. Pericardium: There is no evidence of pericardial effusion. Mitral Valve: The mitral valve is normal in structure. Trivial mitral valve regurgitation. Tricuspid Valve: The tricuspid valve is normal in structure. Tricuspid valve regurgitation is trivial. Aortic Valve: The aortic valve is normal in structure. Aortic valve regurgitation is not visualized. Aortic valve mean gradient measures 3.0 mmHg. Aortic valve peak gradient measures 5.3 mmHg. Aortic valve area, by VTI measures 3.29 cm. Pulmonic Valve: The pulmonic valve was normal in structure. Pulmonic valve regurgitation is not visualized. Aorta: The aortic arch was not well visualized. IAS/Shunts: No atrial level shunt detected by color flow Doppler.  LEFT VENTRICLE PLAX  2D LVIDd:         4.56 cm  Diastology LVIDs:         2.47 cm  LV e' lateral:   9.46 cm/s LV PW:         1.18 cm  LV E/e' lateral: 9.7 LV IVS:        1.38 cm  LV e' medial:    2.83 cm/s LVOT diam:     2.30 cm  LV E/e' medial:  32.3 LV SV:  66 LV SV Index:   34 LVOT Area:     4.15 cm  RIGHT VENTRICLE RV S prime:     15.30 cm/s TAPSE (M-mode): 4.2 cm LEFT ATRIUM             Index       RIGHT ATRIUM           Index LA diam:        4.20 cm 2.14 cm/m  RA Area:     17.00 cm LA Vol (A2C):   77.3 ml 39.30 ml/m RA Volume:   47.50 ml  24.15 ml/m LA Vol (A4C):   47.1 ml 23.95 ml/m LA Biplane Vol: 64.4 ml 32.74 ml/m  AORTIC VALVE                   PULMONIC VALVE AV Area (Vmax):    3.24 cm    PV Vmax:        0.59 m/s AV Area (Vmean):   3.49 cm    PV Peak grad:   1.4 mmHg AV Area (VTI):     3.29 cm    RVOT Peak grad: 2 mmHg AV Vmax:           115.50 cm/s AV Vmean:          75.650 cm/s AV VTI:            0.202 m AV Peak Grad:      5.3 mmHg AV Mean Grad:      3.0 mmHg LVOT Vmax:         90.10 cm/s LVOT Vmean:        63.600 cm/s LVOT VTI:          0.160 m LVOT/AV VTI ratio: 0.79  AORTA Ao Root diam: 3.30 cm MITRAL VALVE               TRICUSPID VALVE MV Area (PHT): 6.54 cm    TR Peak grad:   15.7 mmHg MV Decel Time: 116 msec    TR Vmax:        198.00 cm/s MV E velocity: 91.30 cm/s MV A velocity: 75.00 cm/s  SHUNTS MV E/A ratio:  1.22        Systemic VTI:  0.16 m                            Systemic Diam: 2.30 cm Ashanti Ratti Prince Rome MD Electronically signed by Yolonda Kida MD Signature Date/Time: 05/19/2020/4:17:15 PM    Final    DG HIP UNILAT WITH PELVIS 2-3 VIEWS LEFT  Result Date: 05/19/2020 CLINICAL DATA:  Chronic left groin pain which may be due to remote fall. Initial encounter. EXAM: DG HIP (WITH OR WITHOUT PELVIS) 2-3V LEFT COMPARISON:  CT abdomen and pelvis 09/12/2010. FINDINGS: There is no evidence of hip fracture or dislocation. There is no evidence of arthropathy or other focal bone abnormality.  IMPRESSION: Normal exam. Electronically Signed   By: Inge Rise M.D.   On: 05/19/2020 09:25     Echo preserved overall left ventricular function no significant wall motion abnormalities ejection fraction of at least 70%  TELEMETRY: Normal sinus rhythm nonspecific T changes rate of 70  ASSESSMENT AND PLAN:  Principal Problem:   Hematemesis Active Problems:   GERD (gastroesophageal reflux disease)   Depression   HTN (hypertension)   Symptomatic anemia   Acute blood loss anemia   Chest pain   ADD (  attention deficit disorder) Coronary artery disease Status post cardiac cath GI bleed  Plan Status post cardiac cath with significant coronary disease Patient has distal disease in the circumflex and right Recommend medical therapy for now Have started metoprolol tartrate 25 twice a day Imdur 30 mg once a day and statin Lipitor 80.  We will hold aspirin and/or Plavix for now because of GI bleeding Patient is cleared to undergo GI procedure tomorrow at a mild to moderate wrist for evaluation of source of bleeding Coronary arteries may be potentially amenable to cardiac intervention with stents but medical therapy should be attempted first to try to treat angina Advised patient to refrain from tobacco abuse   Yolonda Kida, MD 05/20/2020 4:51 PM

## 2020-05-20 NOTE — Progress Notes (Signed)
Called to room by patient to visualize output. Stool appears to be yellow clear liquid with a very scant amount of residue noted.

## 2020-05-20 NOTE — Progress Notes (Addendum)
PROGRESS NOTE    Bradley Quinn  JEH:[id number] DOB: 1976-01-05 DOA: 05/18/2020 PCP: Delia Chimes, NP (Inactive)   Brief Narrative:  On 05/18/2020 by Dr. Ivor Costa Bradley Quinn is a 44 y.o. male with medical history significant of hypertension, GERD, depression, anxiety, PTSD, ADD, who presents with hematemesis, black stool, chest pain and shortness of breath.  Patient states that he has been having intermittent hematemesis for 1 week.  He vomited up 2-3 times of coffee-ground materials.  He has dark tarry stool for about 5 times.  He has epigastric abdominal pain, which is constant, sharp, 7 out of 10 severity, nonradiating.  He had 1 loose stool bowel movement earlier today, but currently no diarrhea.  He also reports chest pain, which is located in left side of chest, sharp, 7 out of 10 severity, radiating to the left axillary area.  It is slightly aggravated by deep breaths. No recent long distant traveling.  Denies fever or chills.  No symptoms of UTI or unilateral weakness.  Patient states that he is taking BC Goody powder at home.  Interim history Admitted with shortness of breath and found to have anemia, received transfusion.  GI consulted. Cardiology consulted for cath and preop assessment. cardiac cath pending Assessment & Plan   Hematemesis/ melena/ GI bleed/symptomatic anemia/acute blood loss -Patient presented with shortness of breath along with hematemesis and melena which is been ongoing for the past week -Admits to taking Goody powder approximately 8 doses last Wednesday-for hip pain -Gastroenterology consulted and appreciated-planning for EGD and colonoscopy on 05/20/2020 -Hemoglobin down to 7.8 on admission -Continue IV PPI -Received blood transfusion -hemoglobin 8.3 today (was up to 9.6 on 05/19/2020) -Continue to monitor CBC  GERD -Continue PPI  Depression -Continue Prozac  Essential hypertension -Patient currently on no medications at home, continue IV  hydralazine  Chest pain/abnormal EKG- Unstable angina -Patient with atypical chest pain -D-dimer unremarkable -High-sensitivity troponin trended and unremarkable -Echocardiogram EF 75% -Cardiology consulted and appreciated-planning for cardiac catheterization today -Continue pain control  Left hip pain -Patient does state that he fell a while ago -He has been having hip pain that radiates to his groin -hip x-ray was normal -Continue pain control  ADD -Continue Adderall  THC abuse -UDS positive -Discussed cessation  DVT Prophylaxis  SCDs  Code Status: Full  Family Communication: Wife at bedside  Disposition Plan:  Status is: Inpatient  Remains inpatient appropriate because:Ongoing diagnostic testing needed not appropriate for outpatient work up.  Pending GI work-up   Dispo: The patient is from: Home              Anticipated d/c is to: Home              Anticipated d/c date is: 2 days              Patient currently is not medically stable to d/c.   Consultants Gastroenterology Cardiology  Procedures  Echocardiogram  Antibiotics   Anti-infectives (From admission, onward)   None      Subjective:   Bradley Quinn seen and examined today.  Patient now complains of left lower quadrant pain, instead of his left hip.  He denies current chest pain, shortness of breath, abdominal pain, nausea or vomiting, dizziness.  He does complain of headache.  No fever is able to drink something.    Objective:   Vitals:   05/19/20 2035 05/20/20 0509 05/20/20 0737 05/20/20 1109  BP: (!) 182/110 132/81 125/74 (!) 152/94  Pulse:  87 72 89  Resp:   18 20  Temp:  98.4 F (36.9 C)  98.9 F (37.2 C)  TempSrc:    Oral  SpO2:  98% 100% 100%  Weight:  76.2 kg  76.2 kg  Height:    5\' 11"  (1.803 m)    Intake/Output Summary (Last 24 hours) at 05/20/2020 1306 Last data filed at 05/20/2020 0800 Gross per 24 hour  Intake 2993.52 ml  Output 1625 ml  Net 1368.52 ml   Filed  Weights   05/19/20 0357 05/20/20 0509 05/20/20 1109  Weight: 77 kg 76.2 kg 76.2 kg   Exam  General: Well developed, well nourished, NAD, appears stated age  59: NCAT, mucous membranes moist.   Cardiovascular: S1 S2 auscultated, RRR, no murmur  Respiratory: Clear to auscultation bilaterally  Abdomen: Soft, nontender, nondistended, + bowel sounds  Extremities: warm dry without cyanosis clubbing or edema  Neuro: AAOx3, nonfocal  Psych: Appropriate mood and affect  Data Reviewed: I have personally reviewed following labs and imaging studies  CBC: Recent Labs  Lab 05/19/20 0919 05/19/20 1436 05/19/20 2124 05/20/20 0314 05/20/20 0904  WBC 7.4 7.9 8.5 7.0 7.0  HGB 8.6* 9.0* 9.6* 8.3* 8.3*  HCT 24.7* 25.9* 28.2* 25.0* 25.2*  MCV 78.9* 79.2* 81.5 82.2 82.1  PLT 209 214 235 221 086   Basic Metabolic Panel: Recent Labs  Lab 05/18/20 1330 05/19/20 0223 05/20/20 0314  NA 139 141 144  K 3.6 3.4* 3.5  CL 102 108 112*  CO2 28 25 26   GLUCOSE 115* 97 110*  BUN 14 11 7   CREATININE 1.15 1.10 1.04  CALCIUM 9.3 9.0 8.6*  MG  --  1.9  --    GFR: Estimated Creatinine Clearance: 97.5 mL/min (by C-G formula based on SCr of 1.04 mg/dL). Liver Function Tests: Recent Labs  Lab 05/19/20 0223  AST 22  ALT 26  ALKPHOS 51  BILITOT 0.8  PROT 6.8  ALBUMIN 4.1   No results for input(s): LIPASE, AMYLASE in the last 168 hours. No results for input(s): AMMONIA in the last 168 hours. Coagulation Profile: Recent Labs  Lab 05/18/20 2028  INR 1.1   Cardiac Enzymes: No results for input(s): CKTOTAL, CKMB, CKMBINDEX, TROPONINI in the last 168 hours. BNP (last 3 results) No results for input(s): PROBNP in the last 8760 hours. HbA1C: No results for input(s): HGBA1C in the last 72 hours. CBG: Recent Labs  Lab 05/19/20 0746 05/20/20 0738  GLUCAP 99 101*   Lipid Profile: Recent Labs    05/19/20 0223  CHOL 217*  HDL 41  LDLCALC 139*  TRIG 183*  CHOLHDL 5.3   Thyroid  Function Tests: No results for input(s): TSH, T4TOTAL, FREET4, T3FREE, THYROIDAB in the last 72 hours. Anemia Panel: Recent Labs    05/19/20 0919  VITAMINB12 358  FOLATE 13.3  FERRITIN 16*  TIBC 377  IRON 19*   Urine analysis:    Component Value Date/Time   COLORURINE YELLOW 07/12/2012 0240   APPEARANCEUR CLEAR 07/12/2012 0240   LABSPEC 1.018 07/12/2012 0240   PHURINE 6.0 07/12/2012 0240   GLUCOSEU NEGATIVE 07/12/2012 0240   HGBUR NEGATIVE 07/12/2012 0240   BILIRUBINUR NEGATIVE 07/12/2012 0240   KETONESUR NEGATIVE 07/12/2012 0240   PROTEINUR NEGATIVE 07/12/2012 0240   UROBILINOGEN 0.2 07/12/2012 0240   NITRITE NEGATIVE 07/12/2012 0240   LEUKOCYTESUR NEGATIVE 07/12/2012 0240   Sepsis Labs: @LABRCNTIP (procalcitonin:4,lacticidven:4)  ) Recent Results (from the past 240 hour(s))  SARS Coronavirus 2 by RT PCR (hospital order,  performed in [facility] hospital lab) Nasopharyngeal Nasopharyngeal Swab     Status: None   Collection Time: 05/18/20  3:23 PM   Specimen: Nasopharyngeal Swab  Result Value Ref Range Status   SARS Coronavirus 2 NEGATIVE NEGATIVE Final    Comment: (NOTE) SARS-CoV-2 target nucleic acids are NOT DETECTED.  The SARS-CoV-2 RNA is generally detectable in upper and lower respiratory specimens during the acute phase of infection. The lowest concentration of SARS-CoV-2 viral copies this assay can detect is 250 copies / mL. A negative result does not preclude SARS-CoV-2 infection and should not be used as the sole basis for treatment or other patient management decisions.  A negative result may occur with improper specimen collection / handling, submission of specimen other than nasopharyngeal swab, presence of viral mutation(s) within the areas targeted by this assay, and inadequate number of viral copies (<250 copies / mL). A negative result must be combined with clinical observations, patient history, and epidemiological information.  Fact Sheet for  Patients:   StrictlyIdeas.no  Fact Sheet for Healthcare Providers: BankingDealers.co.za  This test is not yet approved or  cleared by the [place] FDA and has been authorized for detection and/or diagnosis of SARS-CoV-2 by FDA under an Emergency Use Authorization (EUA).  This EUA will remain in effect (meaning this test can be used) for the duration of the COVID-19 declaration under Section 564(b)(1) of the Act, 21 U.S.C. section 360bbb-3(b)(1), unless the authorization is terminated or revoked sooner.  Performed at [facility], [address], [place], [place] [address]       Radiology Studies: DG Chest 2 View  Result Date: 05/18/2020 CLINICAL DATA:  Chest pain and shortness of breath for 1 week EXAM: CHEST - 2 VIEW COMPARISON:  09/27/2010 FINDINGS: The heart size and mediastinal contours are within normal limits. Both lungs are clear. The visualized skeletal structures are unremarkable. IMPRESSION: No active cardiopulmonary disease. Electronically Signed   By: Inez Catalina M.D.   On: 05/18/2020 14:06   ECHOCARDIOGRAM COMPLETE  Result Date: 05/19/2020    ECHOCARDIOGRAM REPORT   Patient Name:   MILOS MILLIGAN Date of Exam: 05/19/2020 Medical Rec #:  [id number]      Height:       71.0 in Accession #:    [id number]     Weight:       169.8 lb Date of Birth:  Aug 23, 1976      BSA:          1.967 m Patient Age:    5 years       BP:           164/96 mmHg Patient Gender: M              HR:           79 bpm. Exam Location:  [facility] Procedure: 2D Echo, Color Doppler and Cardiac Doppler Indications:     Preoperative clearance  History:         Patient has no prior history of Echocardiogram examinations.                  Risk Factors:Hypertension. Anxiety, PTSD.  Sonographer:     Sherrie Sport RDCS (AE) Referring Phys:  [id number] [facility] Shanera Meske Diagnosing Phys: Yolonda Kida MD IMPRESSIONS  1. Left ventricular ejection fraction, by estimation,  is >75%. The left ventricle has hyperdynamic function. The left ventricle has no regional wall motion abnormalities. Left ventricular diastolic parameters were normal.  2. Right  ventricular systolic function is normal. The right ventricular size is normal. There is normal pulmonary artery systolic pressure.  3. The mitral valve is normal in structure. Trivial mitral valve regurgitation.  4. The aortic valve is normal in structure. Aortic valve regurgitation is not visualized. FINDINGS  Left Ventricle: Left ventricular ejection fraction, by estimation, is >75%. The left ventricle has hyperdynamic function. The left ventricle has no regional wall motion abnormalities. The left ventricular internal cavity size was normal in size. There is borderline left ventricular hypertrophy. Left ventricular diastolic parameters were normal. Right Ventricle: The right ventricular size is normal. No increase in right ventricular wall thickness. Right ventricular systolic function is normal. There is normal pulmonary artery systolic pressure. The tricuspid regurgitant velocity is 1.98 m/s, and  with an assumed right atrial pressure of 10 mmHg, the estimated right ventricular systolic pressure is 73.2 mmHg. Left Atrium: Left atrial size was normal in size. Right Atrium: Right atrial size was normal in size. Pericardium: There is no evidence of pericardial effusion. Mitral Valve: The mitral valve is normal in structure. Trivial mitral valve regurgitation. Tricuspid Valve: The tricuspid valve is normal in structure. Tricuspid valve regurgitation is trivial. Aortic Valve: The aortic valve is normal in structure. Aortic valve regurgitation is not visualized. Aortic valve mean gradient measures 3.0 mmHg. Aortic valve peak gradient measures 5.3 mmHg. Aortic valve area, by VTI measures 3.29 cm. Pulmonic Valve: The pulmonic valve was normal in structure. Pulmonic valve regurgitation is not visualized. Aorta: The aortic arch was not well  visualized. IAS/Shunts: No atrial level shunt detected by color flow Doppler.  LEFT VENTRICLE PLAX 2D LVIDd:         4.56 cm  Diastology LVIDs:         2.47 cm  LV e' lateral:   9.46 cm/s LV PW:         1.18 cm  LV E/e' lateral: 9.7 LV IVS:        1.38 cm  LV e' medial:    2.83 cm/s LVOT diam:     2.30 cm  LV E/e' medial:  32.3 LV SV:         66 LV SV Index:   34 LVOT Area:     4.15 cm  RIGHT VENTRICLE RV S prime:     15.30 cm/s TAPSE (M-mode): 4.2 cm LEFT ATRIUM             Index       RIGHT ATRIUM           Index LA diam:        4.20 cm 2.14 cm/m  RA Area:     17.00 cm LA Vol (A2C):   77.3 ml 39.30 ml/m RA Volume:   47.50 ml  24.15 ml/m LA Vol (A4C):   47.1 ml 23.95 ml/m LA Biplane Vol: 64.4 ml 32.74 ml/m  AORTIC VALVE                   PULMONIC VALVE AV Area (Vmax):    3.24 cm    PV Vmax:        0.59 m/s AV Area (Vmean):   3.49 cm    PV Peak grad:   1.4 mmHg AV Area (VTI):     3.29 cm    RVOT Peak grad: 2 mmHg AV Vmax:           115.50 cm/s AV Vmean:          75.650 cm/s AV  VTI:            0.202 m AV Peak Grad:      5.3 mmHg AV Mean Grad:      3.0 mmHg LVOT Vmax:         90.10 cm/s LVOT Vmean:        63.600 cm/s LVOT VTI:          0.160 m LVOT/AV VTI ratio: 0.79  AORTA Ao Root diam: 3.30 cm MITRAL VALVE               TRICUSPID VALVE MV Area (PHT): 6.54 cm    TR Peak grad:   15.7 mmHg MV Decel Time: 116 msec    TR Vmax:        198.00 cm/s MV E velocity: 91.30 cm/s MV A velocity: 75.00 cm/s  SHUNTS MV E/A ratio:  1.22        Systemic VTI:  0.16 m                            Systemic Diam: 2.30 cm Dwayne Prince Rome MD Electronically signed by Yolonda Kida MD Signature Date/Time: 05/19/2020/4:17:15 PM    Final    DG HIP UNILAT WITH PELVIS 2-3 VIEWS LEFT  Result Date: 05/19/2020 CLINICAL DATA:  Chronic left groin pain which may be due to remote fall. Initial encounter. EXAM: DG HIP (WITH OR WITHOUT PELVIS) 2-3V LEFT COMPARISON:  CT abdomen and pelvis 09/12/2010. FINDINGS: There is no evidence of hip  fracture or dislocation. There is no evidence of arthropathy or other focal bone abnormality. IMPRESSION: Normal exam. Electronically Signed   By: Inge Rise M.D.   On: 05/19/2020 09:25     Scheduled Meds: . [MAR Hold] amphetamine-dextroamphetamine  30 mg Oral BID  . [MAR Hold] FLUoxetine  20 mg Oral Daily  . [MAR Hold] pantoprazole  40 mg Intravenous Q12H  . [MAR Hold] sodium chloride flush  3 mL Intravenous Q12H   Continuous Infusions: . [MAR Hold] sodium chloride    . sodium chloride 100 mL/hr at 05/20/20 0833  . sodium chloride    . sodium chloride    . [START ON 05/21/2020] sodium chloride 3 mL/kg/hr (05/20/20 1105)   Followed by  . [START ON 05/21/2020] sodium chloride    . pantoprozole (PROTONIX) infusion 8 mg/hr (05/20/20 0510)     LOS: 1 day   Time Spent in minutes   30 minutes  Stuart Guillen D.O. on 05/20/2020 at 1:06 PM  Between 7am to 7pm - Please see pager noted on [email or web address]  After 7pm go to [email or web address]  And look for the night coverage person covering for me after hours  [facility] Office  [phone]

## 2020-05-21 ENCOUNTER — Encounter: Payer: Self-pay | Admitting: Internal Medicine

## 2020-05-21 LAB — GLUCOSE, CAPILLARY
Glucose-Capillary: 123 mg/dL — ABNORMAL HIGH (ref 70–99)
Glucose-Capillary: 103 mg/dL — ABNORMAL HIGH (ref 70–99)

## 2020-05-21 LAB — CBC
HCT: 22 % — ABNORMAL LOW (ref 39.0–52.0)
Hemoglobin: 7.3 g/dL — ABNORMAL LOW (ref 13.0–17.0)
MCH: 27.3 pg (ref 26.0–34.0)
MCHC: 33.2 g/dL (ref 30.0–36.0)
MCV: 82.4 fL (ref 80.0–100.0)
Platelets: 211 10*3/uL (ref 150–400)
RBC: 2.67 MIL/uL — ABNORMAL LOW (ref 4.22–5.81)
RDW: 15.2 % (ref 11.5–15.5)
WBC: 8.5 10*3/uL (ref 4.0–10.5)
nRBC: 0 % (ref 0.0–0.2)

## 2020-05-21 LAB — HEMOGLOBIN AND HEMATOCRIT, BLOOD
HCT: 21 % — ABNORMAL LOW (ref 39.0–52.0)
Hemoglobin: 6.8 g/dL — ABNORMAL LOW (ref 13.0–17.0)

## 2020-05-21 LAB — BASIC METABOLIC PANEL
Anion gap: 7 (ref 5–15)
BUN: 9 mg/dL (ref 6–20)
CO2: 24 mmol/L (ref 22–32)
Calcium: 8.6 mg/dL — ABNORMAL LOW (ref 8.9–10.3)
Chloride: 109 mmol/L (ref 98–111)
Creatinine, Ser: 0.97 mg/dL (ref 0.61–1.24)
GFR calc Af Amer: >60 mL/min (ref >60)
GFR calc non Af Amer: >60 mL/min (ref >60)
Glucose, Bld: 151 mg/dL — ABNORMAL HIGH (ref 70–99)
Potassium: 3.7 mmol/L (ref 3.5–5.1)
Sodium: 140 mmol/L (ref 135–145)

## 2020-05-21 LAB — HEMOGLOBIN A1C
Hgb A1c MFr Bld: 5.2 % (ref 4.8–5.6)
Mean Plasma Glucose: 103 mg/dL

## 2020-05-21 LAB — PREPARE RBC (CROSSMATCH)

## 2020-05-21 MED ORDER — PEG 3350-KCL-NA BICARB-NACL 420 G PO SOLR
4000.0000 mL | Freq: Once | ORAL | Status: AC
  Administered 2020-05-21: 4000 mL via ORAL
  Filled 2020-05-21: qty 4000

## 2020-05-21 MED ORDER — BUTALBITAL-APAP-CAFFEINE 50-325-40 MG PO TABS
1.0000 | ORAL_TABLET | ORAL | Status: DC | PRN
  Administered 2020-05-21 – 2020-05-23 (×3): 1 via ORAL
  Filled 2020-05-21 (×3): qty 1

## 2020-05-21 MED ORDER — SODIUM CHLORIDE 0.9 % IV SOLN: INTRAVENOUS | Status: DC

## 2020-05-21 MED ORDER — SODIUM CHLORIDE 0.9% IV SOLUTION: Freq: Once | INTRAVENOUS | Status: AC

## 2020-05-21 NOTE — Progress Notes (Signed)
  Results for Bradley Quinn, Bradley Quinn (MRN [id number]) as of 05/21/2020 14:27  Ref. Range  05/21/2020 06:08  05/21/2020 11:56 05/21/2020 13:49  Hemoglobin Latest Ref Range: 13.0 - 17.0 g/dL  7.3 (L)   6.8 (L)  HCT Latest Ref Range: 39 - 52 %  22.0 (L)   21.0 (L)   MD notified

## 2020-05-21 NOTE — Progress Notes (Signed)
[facility]    SUBJECTIVE: Patient states to be doing reasonably well denies any significant chest pain no shortness of breath rested comfortably last night no significant groin issues tolerating antianginal medicines well.  General   Vitals:   05/20/20 1551 05/20/20 2006 05/21/20 0518 05/21/20 0803  BP: (!) 130/94 105/61 106/66 100/65  Pulse: 78 82 69 65  Resp: 18 20 20 18   Temp:  98.7 F (37.1 C) 97.7 F (36.5 C) (!) 97.5 F (36.4 C)  TempSrc:  Oral Oral Oral  SpO2: 100% 99% 99% 100%  Weight:   77 kg   Height:         Intake/Output Summary (Last 24 hours) at 05/21/2020 1610 Last data filed at 05/21/2020 0500 Gross per 24 hour  Intake 2022.88 ml  Output 3400 ml  Net -1377.12 ml      PHYSICAL EXAM  General: Well developed, well nourished, in no acute distress HEENT:  Normocephalic and atramatic Neck:  No JVD.  Lungs: Clear bilaterally to auscultation and percussion. Heart: HRRR . Normal S1 and S2 without gallops or murmurs.  Abdomen: Bowel sounds are positive, abdomen soft and non-tender  Msk:  Back normal, normal gait. Normal strength and tone for age. Extremities: No clubbing, cyanosis or edema.   Neuro: Alert and oriented X 3. Psych:  Good affect, responds appropriately   LABS: Basic Metabolic Panel: Recent Labs    05/19/20 0223 05/19/20 0223 05/20/20 0314 05/21/20 0608  NA 141   < > 144 140  K 3.4*   < > 3.5 3.7  CL 108   < > 112* 109  CO2 25   < > 26 24  GLUCOSE 97   < > 110* 151*  BUN 11   < > 7 9  CREATININE 1.10   < > 1.04 0.97  CALCIUM 9.0   < > 8.6* 8.6*  MG 1.9  --   --   --    < > = values in this interval not displayed.   Liver Function Tests: Recent Labs    05/19/20 0223  AST 22  ALT 26  ALKPHOS 51  BILITOT 0.8  PROT 6.8  ALBUMIN 4.1   No results for input(s): LIPASE, AMYLASE in the last 72 hours. CBC: Recent Labs    05/20/20 0904 05/21/20 0608  WBC 7.0 8.5  HGB 8.3* 7.3*  HCT 25.2* 22.0*  MCV 82.1 82.4  PLT 214 211     Cardiac Enzymes: No results for input(s): CKTOTAL, CKMB, CKMBINDEX, TROPONINI in the last 72 hours. BNP: Invalid input(s): POCBNP D-Dimer: No results for input(s): DDIMER in the last 72 hours. Hemoglobin A1C: No results for input(s): HGBA1C in the last 72 hours. Fasting Lipid Panel: Recent Labs    05/19/20 0223  CHOL 217*  HDL 41  LDLCALC 139*  TRIG 183*  CHOLHDL 5.3   Thyroid Function Tests: No results for input(s): TSH, T4TOTAL, T3FREE, THYROIDAB in the last 72 hours.  Invalid input(s): FREET3 Anemia Panel: Recent Labs    05/19/20 0919  VITAMINB12 358  FOLATE 13.3  FERRITIN 16*  TIBC 377  IRON 19*    CARDIAC CATHETERIZATION  Result Date: 05/20/2020  Dist Cx-1 lesion is 90% stenosed.  Dist Cx-2 lesion is 90% stenosed.  3rd Mrg lesion is 75% stenosed.  RPAV-1 lesion is 70% stenosed.  RPAV-2 lesion is 90% stenosed.  Mid RCA lesion is 30% stenosed.  Prox RCA lesion is 25% stenosed.  Conclusion Normal left ventricular function with ejection fraction  of 70% Coronaries Normal left main Large LAD with minor irregularities Large circumflex with minor irregularities except for very distal AV groove circumflex with tandem 90% lesions very distal and very distal OM 4 with a 75% proximal lesion relatively small vessel RCA large with minor irregularities proximal and mid PDA was also relatively free of disease but the PL had tandem 70% and 90% lesions in a relatively small vessel No evidence of collaterals Intervention was deferred Medical therapy was recommended Patient has been cleared for GI procedure as a mild to moderate risk Hold aspirin therapy for now until cleared by GI to resume   ECHOCARDIOGRAM COMPLETE  Result Date: 05/19/2020    ECHOCARDIOGRAM REPORT   Patient Name:   Bradley Quinn Date of Exam: 05/19/2020 Medical Rec #:  [id number]      Height:       71.0 in Accession #:    [id number]     Weight:       169.8 lb Date of Birth:  [birth year]      BSA:          1.967 m  Patient Age:    44 years       BP:           164/96 mmHg Patient Gender: M              HR:           79 bpm. Exam Location:  [facility] Procedure: 2D Echo, Color Doppler and Cardiac Doppler Indications:     Preoperative clearance  History:         Patient has no prior history of Echocardiogram examinations.                  Risk Factors:Hypertension. Anxiety, PTSD.  Sonographer:     Sherrie Sport RDCS (AE) Referring Phys:  [id number] [facility] MIKHAIL Diagnosing Phys: Yolonda Kida MD IMPRESSIONS  1. Left ventricular ejection fraction, by estimation, is >75%. The left ventricle has hyperdynamic function. The left ventricle has no regional wall motion abnormalities. Left ventricular diastolic parameters were normal.  2. Right ventricular systolic function is normal. The right ventricular size is normal. There is normal pulmonary artery systolic pressure.  3. The mitral valve is normal in structure. Trivial mitral valve regurgitation.  4. The aortic valve is normal in structure. Aortic valve regurgitation is not visualized. FINDINGS  Left Ventricle: Left ventricular ejection fraction, by estimation, is >75%. The left ventricle has hyperdynamic function. The left ventricle has no regional wall motion abnormalities. The left ventricular internal cavity size was normal in size. There is borderline left ventricular hypertrophy. Left ventricular diastolic parameters were normal. Right Ventricle: The right ventricular size is normal. No increase in right ventricular wall thickness. Right ventricular systolic function is normal. There is normal pulmonary artery systolic pressure. The tricuspid regurgitant velocity is 1.98 m/s, and  with an assumed right atrial pressure of 10 mmHg, the estimated right ventricular systolic pressure is 82.4 mmHg. Left Atrium: Left atrial size was normal in size. Right Atrium: Right atrial size was normal in size. Pericardium: There is no evidence of pericardial effusion. Mitral Valve: The mitral valve  is normal in structure. Trivial mitral valve regurgitation. Tricuspid Valve: The tricuspid valve is normal in structure. Tricuspid valve regurgitation is trivial. Aortic Valve: The aortic valve is normal in structure. Aortic valve regurgitation is not visualized. Aortic valve mean gradient measures 3.0 mmHg. Aortic valve peak gradient measures 5.3 mmHg. Aortic valve area, by  VTI measures 3.29 cm. Pulmonic Valve: The pulmonic valve was normal in structure. Pulmonic valve regurgitation is not visualized. Aorta: The aortic arch was not well visualized. IAS/Shunts: No atrial level shunt detected by color flow Doppler.  LEFT VENTRICLE PLAX 2D LVIDd:         4.56 cm  Diastology LVIDs:         2.47 cm  LV e' lateral:   9.46 cm/s LV PW:         1.18 cm  LV E/e' lateral: 9.7 LV IVS:        1.38 cm  LV e' medial:    2.83 cm/s LVOT diam:     2.30 cm  LV E/e' medial:  32.3 LV SV:         66 LV SV Index:   34 LVOT Area:     4.15 cm  RIGHT VENTRICLE RV S prime:     15.30 cm/s TAPSE (M-mode): 4.2 cm LEFT ATRIUM             Index       RIGHT ATRIUM           Index LA diam:        4.20 cm 2.14 cm/m  RA Area:     17.00 cm LA Vol (A2C):   77.3 ml 39.30 ml/m RA Volume:   47.50 ml  24.15 ml/m LA Vol (A4C):   47.1 ml 23.95 ml/m LA Biplane Vol: 64.4 ml 32.74 ml/m  AORTIC VALVE                   PULMONIC VALVE AV Area (Vmax):    3.24 cm    PV Vmax:        0.59 m/s AV Area (Vmean):   3.49 cm    PV Peak grad:   1.4 mmHg AV Area (VTI):     3.29 cm    RVOT Peak grad: 2 mmHg AV Vmax:           115.50 cm/s AV Vmean:          75.650 cm/s AV VTI:            0.202 m AV Peak Grad:      5.3 mmHg AV Mean Grad:      3.0 mmHg LVOT Vmax:         90.10 cm/s LVOT Vmean:        63.600 cm/s LVOT VTI:          0.160 m LVOT/AV VTI ratio: 0.79  AORTA Ao Root diam: 3.30 cm MITRAL VALVE               TRICUSPID VALVE MV Area (PHT): 6.54 cm    TR Peak grad:   15.7 mmHg MV Decel Time: 116 msec    TR Vmax:        198.00 cm/s MV E velocity: 91.30 cm/s MV A  velocity: 75.00 cm/s  SHUNTS MV E/A ratio:  1.22        Systemic VTI:  0.16 m                            Systemic Diam: 2.30 cm Coty Larsh Prince Rome MD Electronically signed by Yolonda Kida MD Signature Date/Time: 05/19/2020/4:17:15 PM    Final    DG HIP UNILAT WITH PELVIS 2-3 VIEWS LEFT  Result Date: 05/19/2020 CLINICAL DATA:  Chronic left groin pain which may  be due to remote fall. Initial encounter. EXAM: DG HIP (WITH OR WITHOUT PELVIS) 2-3V LEFT COMPARISON:  CT abdomen and pelvis 09/12/2010. FINDINGS: There is no evidence of hip fracture or dislocation. There is no evidence of arthropathy or other focal bone abnormality. IMPRESSION: Normal exam. Electronically Signed   By: Inge Rise M.D.   On: 05/19/2020 09:25     Echo preserved left ventricular function ejection fraction of at least 65% urinalysis  TELEMETRY: Normal sinus rhythm nonspecific ST-T wave changes  ASSESSMENT AND PLAN:  Principal Problem:   Hematemesis Active Problems:   GERD (gastroesophageal reflux disease)   Depression   HTN (hypertension)   Symptomatic anemia   Acute blood loss anemia   Chest pain   ADD (attention deficit disorder) Small vessel coronary artery disease Angina Status post cardiac cath Preoperative clearance for GI procedure  Plan Resting comfortably on telemetry no further issues Denies any chest pain angina Tolerating antianginal medicines well Preop for GI procedure EGD colonoscopy Continue metoprolol pre and post procedure as well as Imdur Have the patient follow-up with cardiology upon discharge in 1 to 2 weeks Conservative cardiac input for now We will reevaluate a more aggressive stance later possibly intervention with PCI and stent Continue Lipitor for statin therapy Continue to hold aspirin until cleared by GI to resume 81 mg a day    Yolonda Kida, MD 05/21/2020 8:26 AM

## 2020-05-21 NOTE — Progress Notes (Signed)
Consent for blood obtained & placed in chart,

## 2020-05-21 NOTE — Progress Notes (Signed)
Bradley Quinn , MD [address], [place], [place], [place], [address] [address], [place], [place], [place], [address] Phone: [phone]  Fax: [phone]   Bradley Quinn is being followed for anemia  Day 2 of follow up   Subjective: Doing well no complaints   Objective: Vital signs in last 24 hours: Vitals:   05/20/20 2006 05/21/20 0518 05/21/20 0803 05/21/20 0845  BP: 105/61 106/66 100/65   Pulse: 82 69 65   Resp: 20 20 18 20   Temp: 98.7 F (37.1 C) 97.7 F (36.5 C) (!) 97.5 F (36.4 C)   TempSrc: Oral Oral Oral   SpO2: 99% 99% 100%   Weight:  77 kg    Height:       Weight change: -0.05 kg  Intake/Output Summary (Last 24 hours) at 05/21/2020 1058 Last data filed at 05/21/2020 0500 Gross per 24 hour  Intake 2022.88 ml  Output 3400 ml  Net -1377.12 ml     Exam: Heart:: Regular rate and rhythm, S1S2 present or without murmur or extra heart sounds Lungs: normal, clear to auscultation and clear to auscultation and percussion Abdomen: soft, nontender, normal bowel sounds   Lab Results: @LABTEST2 @ Micro Results: Recent Results (from the past 240 hour(s))  SARS Coronavirus 2 by RT PCR (hospital order, performed in [place] hospital lab) Nasopharyngeal Nasopharyngeal Swab     Status: None   Collection Time: 05/18/20  3:23 PM   Specimen: Nasopharyngeal Swab  Result Value Ref Range Status   SARS Coronavirus 2 NEGATIVE NEGATIVE Final    Comment: (NOTE) SARS-CoV-2 target nucleic acids are NOT DETECTED.  The SARS-CoV-2 RNA is generally detectable in upper and lower respiratory specimens during the acute phase of infection. The lowest concentration of SARS-CoV-2 viral copies this assay can detect is 250 copies / mL. A negative result does not preclude SARS-CoV-2 infection and should not be used as the sole basis for treatment or other patient management decisions.  A negative result may occur with improper specimen collection / handling, submission of  specimen other than nasopharyngeal swab, presence of viral mutation(s) within the areas targeted by this assay, and inadequate number of viral copies (<250 copies / mL). A negative result must be combined with clinical observations, patient history, and epidemiological information.  Fact Sheet for Patients:   StrictlyIdeas.no  Fact Sheet for Healthcare Providers: BankingDealers.co.za  This test is not yet approved or  cleared by the [place] FDA and has been authorized for detection and/or diagnosis of SARS-CoV-2 by FDA under an Emergency Use Authorization (EUA).  This EUA will remain in effect (meaning this test can be used) for the duration of the COVID-19 declaration under Section 564(b)(1) of the Act, 21 U.S.C. section 360bbb-3(b)(1), unless the authorization is terminated or revoked sooner.  Performed at [facility], [address] [place], [place] [address]    Studies/Results: CARDIAC CATHETERIZATION  Result Date: 05/20/2020  Dist Cx-1 lesion is 90% stenosed.  Dist Cx-2 lesion is 90% stenosed.  3rd Mrg lesion is 75% stenosed.  RPAV-1 lesion is 70% stenosed.  RPAV-2 lesion is 90% stenosed.  Mid RCA lesion is 30% stenosed.  Prox RCA lesion is 25% stenosed.  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 lesion relatively small vessel  RCA large with minor irregularities proximal and mid PDA was also relatively free of disease but the PL had tandem 70% and 90% lesions in a relatively small vessel No evidence of collaterals Intervention was deferred Medical therapy was recommended Patient has been cleared for GI procedure as a mild to moderate risk Hold aspirin therapy  for now until cleared by GI to resume   ECHOCARDIOGRAM COMPLETE  Result Date: 05/19/2020    ECHOCARDIOGRAM REPORT   Patient Name:   Bradley Quinn Date of Exam: 05/19/2020 Medical Rec #:  [id number]      Height:       71.0 in Accession #:    [id number]     Weight:       169.8 lb Date of Birth:  November 07, 1976      BSA:          1.967 m Patient Age:    44 years       BP:           164/96 mmHg Patient Gender: M              HR:           79 bpm. Exam Location:  [facility] Procedure: 2D Echo, Color Doppler and Cardiac Doppler Indications:     Preoperative clearance  History:         Patient has no prior history of Echocardiogram examinations.                  Risk Factors:Hypertension. Anxiety, PTSD.  Sonographer:     Sherrie Sport RDCS (AE) Referring Phys:  [id number] [facility] MIKHAIL Diagnosing Phys: Yolonda Kida MD IMPRESSIONS  1. Left ventricular ejection fraction, by estimation, is >75%. The left ventricle has hyperdynamic function. The left ventricle has no regional wall motion abnormalities. Left ventricular diastolic parameters were normal.  2. Right ventricular systolic function is normal. The right ventricular size is normal. There is normal pulmonary artery systolic pressure.  3. The mitral valve is normal in structure. Trivial mitral valve regurgitation.  4. The aortic valve is normal in structure. Aortic valve regurgitation is not visualized. FINDINGS  Left Ventricle: Left ventricular ejection fraction, by estimation, is >75%. The left ventricle has hyperdynamic function. The left ventricle has no regional wall motion abnormalities. The left ventricular internal cavity size was normal in size. There is borderline left ventricular hypertrophy. Left ventricular diastolic parameters were normal. Right Ventricle: The right ventricular size is normal. No increase in right ventricular wall thickness. Right ventricular systolic function is normal. There is normal pulmonary artery systolic pressure. The tricuspid  regurgitant velocity is 1.98 m/s, and  with an assumed right atrial pressure of 10 mmHg, the estimated right ventricular systolic pressure is 23.3 mmHg. Left Atrium: Left atrial size was normal in size. Right Atrium: Right atrial size was normal in size. Pericardium: There is no evidence of pericardial effusion. Mitral Valve: The mitral valve is normal in structure. Trivial mitral valve regurgitation. Tricuspid Valve: The tricuspid valve is normal in structure. Tricuspid valve regurgitation is trivial. Aortic Valve: The aortic valve is normal in structure. Aortic valve regurgitation is not visualized. Aortic valve mean gradient measures 3.0 mmHg. Aortic valve peak gradient measures 5.3 mmHg. Aortic valve area, by VTI measures 3.29 cm. Pulmonic Valve: The pulmonic valve was normal in structure. Pulmonic valve regurgitation is not visualized. Aorta: The aortic arch was not well visualized. IAS/Shunts: No atrial level shunt detected by color flow Doppler.  LEFT VENTRICLE PLAX 2D  LVIDd:         4.56 cm  Diastology LVIDs:         2.47 cm  LV e' lateral:   9.46 cm/s LV PW:         1.18 cm  LV E/e' lateral: 9.7 LV IVS:        1.38 cm  LV e' medial:    2.83 cm/s LVOT diam:     2.30 cm  LV E/e' medial:  32.3 LV SV:         66 LV SV Index:   34 LVOT Area:     4.15 cm  RIGHT VENTRICLE RV S prime:     15.30 cm/s TAPSE (M-mode): 4.2 cm LEFT ATRIUM             Index       RIGHT ATRIUM           Index LA diam:        4.20 cm 2.14 cm/m  RA Area:     17.00 cm LA Vol (A2C):   77.3 ml 39.30 ml/m RA Volume:   47.50 ml  24.15 ml/m LA Vol (A4C):   47.1 ml 23.95 ml/m LA Biplane Vol: 64.4 ml 32.74 ml/m  AORTIC VALVE                   PULMONIC VALVE AV Area (Vmax):    3.24 cm    PV Vmax:        0.59 m/s AV Area (Vmean):   3.49 cm    PV Peak grad:   1.4 mmHg AV Area (VTI):     3.29 cm    RVOT Peak grad: 2 mmHg AV Vmax:           115.50 cm/s AV Vmean:          75.650 cm/s AV VTI:            0.202 m AV Peak Grad:      5.3 mmHg AV  Mean Grad:      3.0 mmHg LVOT Vmax:         90.10 cm/s LVOT Vmean:        63.600 cm/s LVOT VTI:          0.160 m LVOT/AV VTI ratio: 0.79  AORTA Ao Root diam: 3.30 cm MITRAL VALVE               TRICUSPID VALVE MV Area (PHT): 6.54 cm    TR Peak grad:   15.7 mmHg MV Decel Time: 116 msec    TR Vmax:        198.00 cm/s MV E velocity: 91.30 cm/s MV A velocity: 75.00 cm/s  SHUNTS MV E/A ratio:  1.22        Systemic VTI:  0.16 m                            Systemic Diam: 2.30 cm Dwayne Prince Rome MD Electronically signed by Yolonda Kida MD Signature Date/Time: 05/19/2020/4:17:15 PM    Final    Medications: I have reviewed the patient's current medications. Scheduled Meds: . amphetamine-dextroamphetamine  30 mg Oral BID  . atorvastatin  80 mg Oral Daily  . FLUoxetine  20 mg Oral Daily  . isosorbide mononitrate  30 mg Oral Daily  . metoprolol tartrate  25 mg Oral BID  . [START ON 05/22/2020] pantoprazole  40 mg Intravenous Q12H  .  sodium chloride flush  3 mL Intravenous Q12H  . sodium chloride flush  3 mL Intravenous Q12H   Continuous Infusions: . sodium chloride    . sodium chloride 100 mL/hr at 05/21/20 0839  . sodium chloride    . sodium chloride    . pantoprozole (PROTONIX) infusion 8 mg/hr (05/21/20 0840)   PRN Meds:.sodium chloride, acetaminophen, acetaminophen, ALPRAZolam, hydrALAZINE, morphine injection, ondansetron (ZOFRAN) IV, ondansetron (ZOFRAN) IV, ramelteon, sodium chloride flush   Assessment: Principal Problem:   Hematemesis Active Problems:   GERD (gastroesophageal reflux disease)   Depression   HTN (hypertension)   Symptomatic anemia   Acute blood loss anemia   Chest pain   ADD (attention deficit disorder)  Bradley Quinn is a 44 y.o. y/o male presented to the emergency room with chest pain hematemesis and melena.  Noted to have hemoglobin of 7.8 g with an MCV of 79.  No elevation of BUN/creatinine ratio.  I suspect he has had some chronic blood loss and his anemia is not  completely acute as he has no elevated BUN/creatinine ratio and he has microcytosis which is new onset.  Chronic blood loss could be from NSAID usage.  Iron studies demonstrate iron deficiency anemia S/p cardiac cath significant disease   Plan 1.  Monitor CBC and transfuse 2.  Continue IV PPI 3.  Stop all cannabis use. 4.  EGD+colonoscopy tomorrow with Dr Marius Ditch  I have discussed alternative options, risks & benefits,  which include, but are not limited to, bleeding, infection, perforation,respiratory complication & drug reaction.  The patient agrees with this plan & written consent will be obtained.         LOS: 2 days   Bradley Bellows, MD 05/21/2020, 10:58 AM

## 2020-05-21 NOTE — Progress Notes (Signed)
Consent for EGD/colonscopy obtained & placed in chart.

## 2020-05-21 NOTE — Progress Notes (Addendum)
PROGRESS NOTE    Bradley Quinn  YQM:[id number] DOB: Oct 29, 1976 DOA: 05/18/2020 PCP: Delia Chimes, NP (Inactive)   Brief Narrative:  On 05/18/2020 by Dr. Ivor Costa Bradley Quinn is a 44 y.o. male with medical history significant of hypertension, GERD, depression, anxiety, PTSD, ADD, who presents with hematemesis, black stool, chest pain and shortness of breath.  Patient states that he has been having intermittent hematemesis for 1 week.  He vomited up 2-3 times of coffee-ground materials.  He has dark tarry stool for about 5 times.  He has epigastric abdominal pain, which is constant, sharp, 7 out of 10 severity, nonradiating.  He had 1 loose stool bowel movement earlier today, but currently no diarrhea.  He also reports chest pain, which is located in left side of chest, sharp, 7 out of 10 severity, radiating to the left axillary area.  It is slightly aggravated by deep breaths. No recent long distant traveling.  Denies fever or chills.  No symptoms of UTI or unilateral weakness.  Patient states that he is taking BC Goody powder at home.  Interim history Admitted with shortness of breath and found to have anemia, received transfusion.  GI consulted. Cardiology consulted for cath and preop assessment. S/p cardiac cath.  Assessment & Plan   Hematemesis/ melena/ GI bleed/symptomatic anemia/acute blood loss -Patient presented with shortness of breath along with hematemesis and melena which is been ongoing for the past week -Admits to taking Goody powder approximately 8 doses last Wednesday-for hip pain -Gastroenterology consulted and appreciated-planning for EGD and colonoscopy on 05/20/2020 -Hemoglobin down to 7.8 on admission -Continue IV PPI -Received blood transfusion -hemoglobin 7.3 today (was up to 9.6 on 05/19/2020)- will recheck H/H later today, if below 7, will transfuse -Continue to monitor CBC  GERD -Continue PPI  Depression -Continue Prozac  Essential hypertension -Patient  currently on no medications at home, continue IV hydralazine  Chest pain/abnormal EKG- Unstable angina -Patient with atypical chest pain -D-dimer unremarkable -High-sensitivity troponin trended and unremarkable -Echocardiogram EF 75% -Cardiology consulted and appreciated -S/p cardiac catheterization- recommended medical therapy, mild-moderate risk, may proceed with GI procedures -Continue pain control  Left hip pain -Patient does state that he fell a while ago -He has been having hip pain that radiates to his groin -hip x-ray was normal -Continue pain control  ADD -Continue Adderall  THC abuse -UDS positive -Discussed cessation  DVT Prophylaxis  SCDs  Code Status: Full  Family Communication: Wife at bedside  Disposition Plan:  Status is: Inpatient  Remains inpatient appropriate because:Ongoing diagnostic testing needed not appropriate for outpatient work up.  Pending GI work-up   Dispo: The patient is from: Home              Anticipated d/c is to: Home              Anticipated d/c date is: 2 days              Patient currently is not medically stable to d/c.   Consultants Gastroenterology Cardiology  Procedures  Echocardiogram Cardiac catheterization  Antibiotics   Anti-infectives (From admission, onward)   None      Subjective:   Bradley Quinn seen and examined today.  Patient is feeling better today. He denies current chest pain, shortness of breath, abdominal pain, N/V, dizziness, or headache.   Objective:   Vitals:   05/20/20 2006 05/21/20 0518 05/21/20 0803 05/21/20 0845  BP: 105/61 106/66 100/65   Pulse: 82 69 65  Resp: 20 20 18 20   Temp: 98.7 F (37.1 C) 97.7 F (36.5 C) (!) 97.5 F (36.4 C)   TempSrc: Oral Oral Oral   SpO2: 99% 99% 100%   Weight:  77 kg    Height:        Intake/Output Summary (Last 24 hours) at 05/21/2020 1130 Last data filed at 05/21/2020 0500 Gross per 24 hour  Intake 2022.88 ml  Output 3400 ml  Net -1377.12 ml     Filed Weights   05/20/20 0509 05/20/20 1109 05/21/20 0518  Weight: 76.2 kg 76.2 kg 77 kg   Exam  General: Well developed, well nourished, NAD, appears stated age  95: NCAT, mucous membranes moist.   Cardiovascular: S1 S2 auscultated, RRR, no murmur  Respiratory: Clear to auscultation bilaterally  Abdomen: Soft, nontender, nondistended, + bowel sounds  Extremities: warm dry without cyanosis clubbing or edema  Neuro: AAOx3, nonfocal  Psych: Appropriate mood and affect  Data Reviewed: I have personally reviewed following labs and imaging studies  CBC: Recent Labs  Lab 05/19/20 1436 05/19/20 2124 05/20/20 0314 05/20/20 0904 05/21/20 0608  WBC 7.9 8.5 7.0 7.0 8.5  HGB 9.0* 9.6* 8.3* 8.3* 7.3*  HCT 25.9* 28.2* 25.0* 25.2* 22.0*  MCV 79.2* 81.5 82.2 82.1 82.4  PLT 214 235 221 214 440   Basic Metabolic Panel: Recent Labs  Lab 05/18/20 1330 05/19/20 0223 05/20/20 0314 05/21/20 0608  NA 139 141 144 140  K 3.6 3.4* 3.5 3.7  CL 102 108 112* 109  CO2 28 25 26 24   GLUCOSE 115* 97 110* 151*  BUN 14 11 7 9   CREATININE 1.15 1.10 1.04 0.97  CALCIUM 9.3 9.0 8.6* 8.6*  MG  --  1.9  --   --    GFR: Estimated Creatinine Clearance: 104.6 mL/min (by C-G formula based on SCr of 0.97 mg/dL). Liver Function Tests: Recent Labs  Lab 05/19/20 0223  AST 22  ALT 26  ALKPHOS 51  BILITOT 0.8  PROT 6.8  ALBUMIN 4.1   No results for input(s): LIPASE, AMYLASE in the last 168 hours. No results for input(s): AMMONIA in the last 168 hours. Coagulation Profile: Recent Labs  Lab 05/18/20 2028  INR 1.1   Cardiac Enzymes: No results for input(s): CKTOTAL, CKMB, CKMBINDEX, TROPONINI in the last 168 hours. BNP (last 3 results) No results for input(s): PROBNP in the last 8760 hours. HbA1C: Recent Labs    05/19/20 0223  HGBA1C 5.2   CBG: Recent Labs  Lab 05/19/20 0746 05/20/20 0738 05/21/20 0804  GLUCAP 99 101* 123*   Lipid Profile: Recent Labs    05/19/20 0223   CHOL 217*  HDL 41  LDLCALC 139*  TRIG 183*  CHOLHDL 5.3   Thyroid Function Tests: No results for input(s): TSH, T4TOTAL, FREET4, T3FREE, THYROIDAB in the last 72 hours. Anemia Panel: Recent Labs    05/19/20 0919  VITAMINB12 358  FOLATE 13.3  FERRITIN 16*  TIBC 377  IRON 19*   Urine analysis:    Component Value Date/Time   COLORURINE YELLOW 07/12/2012 0240   APPEARANCEUR CLEAR 07/12/2012 0240   LABSPEC 1.018 07/12/2012 0240   PHURINE 6.0 07/12/2012 0240   GLUCOSEU NEGATIVE 07/12/2012 0240   HGBUR NEGATIVE 07/12/2012 0240   BILIRUBINUR NEGATIVE 07/12/2012 0240   KETONESUR NEGATIVE 07/12/2012 0240   PROTEINUR NEGATIVE 07/12/2012 0240   UROBILINOGEN 0.2 07/12/2012 0240   NITRITE NEGATIVE 07/12/2012 0240   LEUKOCYTESUR NEGATIVE 07/12/2012 0240   Sepsis Labs: @LABRCNTIP (procalcitonin:4,lacticidven:4)  ) Recent  Results (from the past 240 hour(s))  SARS Coronavirus 2 by RT PCR (hospital order, performed in [facility] hospital lab) Nasopharyngeal Nasopharyngeal Swab     Status: None   Collection Time: 05/18/20  3:23 PM   Specimen: Nasopharyngeal Swab  Result Value Ref Range Status   SARS Coronavirus 2 NEGATIVE NEGATIVE Final    Comment: (NOTE) SARS-CoV-2 target nucleic acids are NOT DETECTED.  The SARS-CoV-2 RNA is generally detectable in upper and lower respiratory specimens during the acute phase of infection. The lowest concentration of SARS-CoV-2 viral copies this assay can detect is 250 copies / mL. A negative result does not preclude SARS-CoV-2 infection and should not be used as the sole basis for treatment or other patient management decisions.  A negative result may occur with improper specimen collection / handling, submission of specimen other than nasopharyngeal swab, presence of viral mutation(s) within the areas targeted by this assay, and inadequate number of viral copies (<250 copies / mL). A negative result must be combined with  clinical observations, patient history, and epidemiological information.  Fact Sheet for Patients:   StrictlyIdeas.no  Fact Sheet for Healthcare Providers: BankingDealers.co.za  This test is not yet approved or  cleared by the [place] FDA and has been authorized for detection and/or diagnosis of SARS-CoV-2 by FDA under an Emergency Use Authorization (EUA).  This EUA will remain in effect (meaning this test can be used) for the duration of the COVID-19 declaration under Section 564(b)(1) of the Act, 21 U.S.C. section 360bbb-3(b)(1), unless the authorization is terminated or revoked sooner.  Performed at [facility], [address], [place], [place] [address]       Radiology Studies: CARDIAC CATHETERIZATION  Result Date: 05/20/2020  Dist Cx-1 lesion is 90% stenosed.  Dist Cx-2 lesion is 90% stenosed.  3rd Mrg lesion is 75% stenosed.  RPAV-1 lesion is 70% stenosed.  RPAV-2 lesion is 90% stenosed.  Mid RCA lesion is 30% stenosed.  Prox RCA lesion is 25% stenosed.  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 lesion relatively small vessel RCA large with minor irregularities proximal and mid PDA was also relatively free of disease but the PL had tandem 70% and 90% lesions in a relatively small vessel No evidence of collaterals Intervention was deferred Medical therapy was recommended Patient has been cleared for GI procedure as a mild to moderate risk Hold aspirin therapy for now until cleared by GI to resume   ECHOCARDIOGRAM COMPLETE  Result Date: 05/19/2020    ECHOCARDIOGRAM REPORT   Patient Name:   DEANDRA GADSON Date of Exam: 05/19/2020 Medical Rec #:  [id number]       Height:       71.0 in Accession #:    [id number]     Weight:       169.8 lb Date of Birth:  1976-01-05      BSA:          1.967 m Patient Age:    44 years       BP:           164/96 mmHg Patient Gender: M              HR:           79 bpm.  Exam Location:  [facility] Procedure: 2D Echo, Color Doppler and Cardiac Doppler Indications:     Preoperative clearance  History:         Patient has no prior history of Echocardiogram examinations.                  Risk Factors:Hypertension. Anxiety, PTSD.  Sonographer:     Sherrie Sport RDCS (AE) Referring Phys:  [id number] [facility] Lelon Ikard Diagnosing Phys: Yolonda Kida MD IMPRESSIONS  1. Left ventricular ejection fraction, by estimation, is >75%. The left ventricle has hyperdynamic function. The left ventricle has no regional wall motion abnormalities. Left ventricular diastolic parameters were normal.  2. Right ventricular systolic function is normal. The right ventricular size is normal. There is normal pulmonary artery systolic pressure.  3. The mitral valve is normal in structure. Trivial mitral valve regurgitation.  4. The aortic valve is normal in structure. Aortic valve regurgitation is not visualized. FINDINGS  Left Ventricle: Left ventricular ejection fraction, by estimation, is >75%. The left ventricle has hyperdynamic function. The left ventricle has no regional wall motion abnormalities. The left ventricular internal cavity size was normal in size. There is borderline left ventricular hypertrophy. Left ventricular diastolic parameters were normal. Right Ventricle: The right ventricular size is normal. No increase in right ventricular wall thickness. Right ventricular systolic function is normal. There is normal pulmonary artery systolic pressure. The tricuspid regurgitant velocity is 1.98 m/s, and  with an assumed right atrial pressure of 10 mmHg, the estimated right ventricular systolic pressure is 76.2 mmHg. Left Atrium: Left atrial size was normal in size. Right Atrium:  Right atrial size was normal in size. Pericardium: There is no evidence of pericardial effusion. Mitral Valve: The mitral valve is normal in structure. Trivial mitral valve regurgitation. Tricuspid Valve: The tricuspid valve is normal in structure. Tricuspid valve regurgitation is trivial. Aortic Valve: The aortic valve is normal in structure. Aortic valve regurgitation is not visualized. Aortic valve mean gradient measures 3.0 mmHg. Aortic valve peak gradient measures 5.3 mmHg. Aortic valve area, by VTI measures 3.29 cm. Pulmonic Valve: The pulmonic valve was normal in structure. Pulmonic valve regurgitation is not visualized. Aorta: The aortic arch was not well visualized. IAS/Shunts: No atrial level shunt detected by color flow Doppler.  LEFT VENTRICLE PLAX 2D LVIDd:         4.56 cm  Diastology LVIDs:         2.47 cm  LV e' lateral:   9.46 cm/s LV PW:         1.18 cm  LV E/e' lateral: 9.7 LV IVS:        1.38 cm  LV e' medial:    2.83 cm/s LVOT diam:     2.30 cm  LV E/e' medial:  32.3 LV SV:         66 LV SV Index:   34 LVOT Area:     4.15 cm  RIGHT VENTRICLE RV S prime:     15.30 cm/s TAPSE (M-mode): 4.2 cm LEFT ATRIUM             Index       RIGHT ATRIUM           Index LA diam:        4.20 cm 2.14 cm/m  RA Area:     17.00 cm LA Vol (A2C):   77.3 ml 39.30 ml/m RA Volume:   47.50 ml  24.15 ml/m LA Vol (A4C):   47.1 ml 23.95  ml/m LA Biplane Vol: 64.4 ml 32.74 ml/m  AORTIC VALVE                   PULMONIC VALVE AV Area (Vmax):    3.24 cm    PV Vmax:        0.59 m/s AV Area (Vmean):   3.49 cm    PV Peak grad:   1.4 mmHg AV Area (VTI):     3.29 cm    RVOT Peak grad: 2 mmHg AV Vmax:           115.50 cm/s AV Vmean:          75.650 cm/s AV VTI:            0.202 m AV Peak Grad:      5.3 mmHg AV Mean Grad:      3.0 mmHg LVOT Vmax:         90.10 cm/s LVOT Vmean:        63.600 cm/s LVOT VTI:          0.160 m LVOT/AV VTI ratio: 0.79  AORTA Ao Root diam: 3.30 cm MITRAL VALVE               TRICUSPID VALVE MV Area  (PHT): 6.54 cm    TR Peak grad:   15.7 mmHg MV Decel Time: 116 msec    TR Vmax:        198.00 cm/s MV E velocity: 91.30 cm/s MV A velocity: 75.00 cm/s  SHUNTS MV E/A ratio:  1.22        Systemic VTI:  0.16 m                            Systemic Diam: 2.30 cm Dwayne Prince Rome MD Electronically signed by Yolonda Kida MD Signature Date/Time: 05/19/2020/4:17:15 PM    Final      Scheduled Meds: . amphetamine-dextroamphetamine  30 mg Oral BID  . atorvastatin  80 mg Oral Daily  . FLUoxetine  20 mg Oral Daily  . isosorbide mononitrate  30 mg Oral Daily  . metoprolol tartrate  25 mg Oral BID  . [START ON 05/22/2020] pantoprazole  40 mg Intravenous Q12H  . polyethylene glycol-electrolytes  4,000 mL Oral Once  . sodium chloride flush  3 mL Intravenous Q12H  . sodium chloride flush  3 mL Intravenous Q12H   Continuous Infusions: . sodium chloride    . sodium chloride 100 mL/hr at 05/21/20 0839  . sodium chloride    . sodium chloride    . sodium chloride    . pantoprozole (PROTONIX) infusion 8 mg/hr (05/21/20 0840)     LOS: 2 days   Time Spent in minutes   30 minutes  Cuong Moorman D.O. on 05/21/2020 at 11:30 AM  Between 7am to 7pm - Please see pager noted on [email or web address]  After 7pm go to [email or web address]  And look for the night coverage person covering for me after hours  [facility] Office  [phone]

## 2020-05-22 ENCOUNTER — Inpatient Hospital Stay: Payer: Self-pay | Admitting: Anesthesiology

## 2020-05-22 ENCOUNTER — Encounter
Admission: EM | Disposition: A | Discharge status: Home / Self Care | Payer: Self-pay | Source: Home / Self Care | Attending: Internal Medicine

## 2020-05-22 DIAGNOSIS — T39395A Adverse effect of other nonsteroidal anti-inflammatory drugs [NSAID], initial encounter: Secondary | ICD-10-CM

## 2020-05-22 DIAGNOSIS — D62 Acute posthemorrhagic anemia: Secondary | ICD-10-CM

## 2020-05-22 DIAGNOSIS — K922 Gastrointestinal hemorrhage, unspecified: Secondary | ICD-10-CM

## 2020-05-22 DIAGNOSIS — K920 Hematemesis: Secondary | ICD-10-CM

## 2020-05-22 HISTORY — PX: COLONOSCOPY WITH PROPOFOL: SHX5780

## 2020-05-22 HISTORY — PX: ESOPHAGOGASTRODUODENOSCOPY (EGD) WITH PROPOFOL: SHX5813

## 2020-05-22 LAB — CBC
HCT: 25.9 % — ABNORMAL LOW (ref 39.0–52.0)
Hemoglobin: 8.4 g/dL — ABNORMAL LOW (ref 13.0–17.0)
MCH: 27.4 pg (ref 26.0–34.0)
MCHC: 32.4 g/dL (ref 30.0–36.0)
MCV: 84.4 fL (ref 80.0–100.0)
Platelets: 192 10*3/uL (ref 150–400)
RBC: 3.07 MIL/uL — ABNORMAL LOW (ref 4.22–5.81)
RDW: 15.1 % (ref 11.5–15.5)
WBC: 8.9 10*3/uL (ref 4.0–10.5)
nRBC: 0 % (ref 0.0–0.2)

## 2020-05-22 LAB — TYPE AND SCREEN
ABO/RH(D): O POS
Antibody Screen: NEGATIVE
Unit division: 0
Unit division: 0
Unit division: 0

## 2020-05-22 LAB — BPAM RBC
Blood Product Expiration Date: 202106302359
Blood Product Expiration Date: 202107262359
Blood Product Expiration Date: 202107262359
ISSUE DATE / TIME: 202106221626
ISSUE DATE / TIME: 202106251507
Unit Type and Rh: 5100
Unit Type and Rh: 5100
Unit Type and Rh: 5100

## 2020-05-22 LAB — BASIC METABOLIC PANEL
Anion gap: 3 — ABNORMAL LOW (ref 5–15)
BUN: 13 mg/dL (ref 6–20)
CO2: 28 mmol/L (ref 22–32)
Calcium: 8.7 mg/dL — ABNORMAL LOW (ref 8.9–10.3)
Chloride: 111 mmol/L (ref 98–111)
Creatinine, Ser: 1.07 mg/dL (ref 0.61–1.24)
GFR calc Af Amer: >60 mL/min (ref >60)
GFR calc non Af Amer: >60 mL/min (ref >60)
Glucose, Bld: 105 mg/dL — ABNORMAL HIGH (ref 70–99)
Potassium: 4 mmol/L (ref 3.5–5.1)
Sodium: 142 mmol/L (ref 135–145)

## 2020-05-22 LAB — GLUCOSE, CAPILLARY: Glucose-Capillary: 79 mg/dL (ref 70–99)

## 2020-05-22 SURGERY — ESOPHAGOGASTRODUODENOSCOPY (EGD) WITH PROPOFOL
Anesthesia: General

## 2020-05-22 MED ORDER — BUTAMBEN-TETRACAINE-BENZOCAINE 2-2-14 % EX AERO
INHALATION_SPRAY | CUTANEOUS | Status: DC | PRN
Start: 2020-05-22 — End: 2020-05-22
  Administered 2020-05-22: 1 via TOPICAL

## 2020-05-22 MED ORDER — PHENYLEPHRINE HCL (PRESSORS) 10 MG/ML IV SOLN
INTRAVENOUS | Status: AC
  Filled 2020-05-22: qty 1

## 2020-05-22 MED ORDER — SODIUM CHLORIDE 0.9 % IV SOLN
300.0000 mg | Freq: Once | INTRAVENOUS | Status: AC
  Administered 2020-05-22: 300 mg via INTRAVENOUS
  Filled 2020-05-22: qty 15

## 2020-05-22 MED ORDER — LIDOCAINE HCL (PF) 2 % IJ SOLN
INTRAMUSCULAR | Status: AC
  Filled 2020-05-22: qty 5

## 2020-05-22 MED ORDER — PROPOFOL 10 MG/ML IV BOLUS
INTRAVENOUS | Status: DC | PRN
  Administered 2020-05-22: 50 mg via INTRAVENOUS

## 2020-05-22 MED ORDER — PANTOPRAZOLE SODIUM 40 MG PO TBEC
40.0000 mg | DELAYED_RELEASE_TABLET | Freq: Two times a day (BID) | ORAL | Status: DC
  Administered 2020-05-22 – 2020-05-23 (×2): 40 mg via ORAL
  Filled 2020-05-22 (×2): qty 1

## 2020-05-22 MED ORDER — FENTANYL CITRATE (PF) 100 MCG/2ML IJ SOLN
INTRAMUSCULAR | Status: AC
  Filled 2020-05-22: qty 2

## 2020-05-22 MED ORDER — LIDOCAINE HCL (CARDIAC) PF 100 MG/5ML IV SOSY
PREFILLED_SYRINGE | INTRAVENOUS | Status: DC | PRN
  Administered 2020-05-22: 60 mg via INTRAVENOUS

## 2020-05-22 MED ORDER — GLYCOPYRROLATE 0.2 MG/ML IJ SOLN
INTRAMUSCULAR | Status: AC
  Filled 2020-05-22: qty 1

## 2020-05-22 MED ORDER — SODIUM CHLORIDE 0.9 % IV SOLN: INTRAVENOUS | Status: DC

## 2020-05-22 MED ORDER — GLYCOPYRROLATE 0.2 MG/ML IJ SOLN
INTRAMUSCULAR | Status: DC | PRN
  Administered 2020-05-22: .1 mg via INTRAVENOUS

## 2020-05-22 MED ORDER — PROPOFOL 500 MG/50ML IV EMUL
INTRAVENOUS | Status: AC
  Filled 2020-05-22: qty 100

## 2020-05-22 MED ORDER — BUTAMBEN-TETRACAINE-BENZOCAINE 2-2-14 % EX AERO
INHALATION_SPRAY | CUTANEOUS | Status: AC
  Filled 2020-05-22: qty 5

## 2020-05-22 MED ORDER — PROPOFOL 500 MG/50ML IV EMUL
INTRAVENOUS | Status: DC | PRN
  Administered 2020-05-22: 100 ug/kg/min via INTRAVENOUS

## 2020-05-22 MED ORDER — FENTANYL CITRATE (PF) 100 MCG/2ML IJ SOLN
INTRAMUSCULAR | Status: DC | PRN
  Administered 2020-05-22: 50 ug via INTRAVENOUS

## 2020-05-22 MED ORDER — ASPIRIN 81 MG PO CHEW
81.0000 mg | CHEWABLE_TABLET | Freq: Every day | ORAL | Status: DC
  Administered 2020-05-22 – 2020-05-23 (×2): 81 mg via ORAL
  Filled 2020-05-22 (×2): qty 1

## 2020-05-22 MED ORDER — ONDANSETRON HCL 4 MG/2ML IJ SOLN: 4.0000 mg | Freq: Once | INTRAMUSCULAR | Status: DC | PRN

## 2020-05-22 NOTE — Progress Notes (Signed)
Patient Name: JASIN BRAZEL Date of Encounter: 05/22/2020  Hospital Problem List     Principal Problem:   Hematemesis Active Problems:   GERD (gastroesophageal reflux disease)   Depression   HTN (hypertension)   Symptomatic anemia   Acute blood loss anemia   Chest pain   ADD (attention deficit disorder)    Patient Profile     44 year old male with history of hypertension, coronary artery disease, gastroesophageal reflux disease admitted with GI bleed and anemia.  Underwent endoscopy this morning.  Hemoglobin 6.8 yesterday.  Received a unit of blood.  Hemoglobin increased to 8.4.  Was on enteric-coated aspirin which is been held.  Ruled out for myocardial infarction.  Cardiac cath revealed 90% distal left circumflex, 75% third marginal, 90% right posterior AV groove, EF 70%, medical management recommended due to no high-grade proximal disease with distal vessel disease only.  Subjective   Doing well anxious to go home.  Inpatient Medications     [MAR Hold] amphetamine-dextroamphetamine  30 mg Oral BID   aspirin  81 mg Oral Daily   [MAR Hold] atorvastatin  80 mg Oral Daily   [MAR Hold] FLUoxetine  20 mg Oral Daily   [MAR Hold] isosorbide mononitrate  30 mg Oral Daily   [MAR Hold] metoprolol tartrate  25 mg Oral BID   pantoprazole  40 mg Oral BID AC   [MAR Hold] sodium chloride flush  3 mL Intravenous Q12H   [MAR Hold] sodium chloride flush  3 mL Intravenous Q12H    Vital Signs    Vitals:   05/22/20 0929 05/22/20 0934 05/22/20 0940 05/22/20 0943  BP:  116/68  131/71  Pulse: 67 65 69 62  Resp: (!) 23 19 14 14   Temp:  (!) 96.8 F (36 C)    TempSrc:      SpO2: 100% 100% 100% 100%  Weight:      Height:        Intake/Output Summary (Last 24 hours) at 05/22/2020 0947 Last data filed at 05/22/2020 0931 Gross per 24 hour  Intake 3129.78 ml  Output 300 ml  Net 2829.78 ml   Filed Weights   05/21/20 0518 05/22/20 0650 05/22/20 0801  Weight: 77 kg 79.3 kg  79.3 kg    Physical Exam    GEN: Well nourished, well developed, in no acute distress.  HEENT: normal.  Neck: Supple, no JVD, carotid bruits, or masses. Cardiac: RRR, no murmurs, rubs, or gallops. No clubbing, cyanosis, edema.  Radials/DP/PT 2+ and equal bilaterally.  Respiratory:  Respirations regular and unlabored, clear to auscultation bilaterally. GI: Soft, nontender, nondistended, BS + x 4. MS: no deformity or atrophy. Skin: warm and dry, no rash. Neuro:  Strength and sensation are intact. Psych: Normal affect.  Labs    CBC Recent Labs    05/21/20 0608 05/21/20 0608 05/21/20 1349 05/22/20 0333  WBC 8.5  --   --  8.9  HGB 7.3*   < > 6.8* 8.4*  HCT 22.0*   < > 21.0* 25.9*  MCV 82.4  --   --  84.4  PLT 211  --   --  192   < > = values in this interval not displayed.   Basic Metabolic Panel Recent Labs    05/21/20 0608 05/22/20 0333  NA 140 142  K 3.7 4.0  CL 109 111  CO2 24 28  GLUCOSE 151* 105*  BUN 9 13  CREATININE 0.97 1.07  CALCIUM 8.6* 8.7*   Liver Function  Tests No results for input(s): AST, ALT, ALKPHOS, BILITOT, PROT, ALBUMIN in the last 72 hours. No results for input(s): LIPASE, AMYLASE in the last 72 hours. Cardiac Enzymes No results for input(s): CKTOTAL, CKMB, CKMBINDEX, TROPONINI in the last 72 hours. BNP No results for input(s): BNP in the last 72 hours. D-Dimer No results for input(s): DDIMER in the last 72 hours. Hemoglobin A1C No results for input(s): HGBA1C in the last 72 hours. Fasting Lipid Panel No results for input(s): CHOL, HDL, LDLCALC, TRIG, CHOLHDL, LDLDIRECT in the last 72 hours. Thyroid Function Tests No results for input(s): TSH, T4TOTAL, T3FREE, THYROIDAB in the last 72 hours.  Invalid input(s): FREET3  Telemetry    Sinus rhythm  ECG    Sinus rhythm with nonspecific ST-T wave changes  Radiology    DG Chest 2 View  Result Date: 05/18/2020 CLINICAL DATA:  Chest pain and shortness of breath for 1 week EXAM: CHEST  - 2 VIEW COMPARISON:  09/27/2010 FINDINGS: The heart size and mediastinal contours are within normal limits. Both lungs are clear. The visualized skeletal structures are unremarkable. IMPRESSION: No active cardiopulmonary disease. Electronically Signed   By: Inez Catalina M.D.   On: 05/18/2020 14:06   CARDIAC CATHETERIZATION  Result Date: 05/20/2020  Dist Cx-1 lesion is 90% stenosed.  Dist Cx-2 lesion is 90% stenosed.  3rd Mrg lesion is 75% stenosed.  RPAV-1 lesion is 70% stenosed.  RPAV-2 lesion is 90% stenosed.  Mid RCA lesion is 30% stenosed.  Prox RCA lesion is 25% stenosed.  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 lesion relatively small vessel RCA large with minor irregularities proximal and mid PDA was also relatively free of disease but the PL had tandem 70% and 90% lesions in a relatively small vessel No evidence of collaterals Intervention was deferred Medical therapy was recommended Patient has been cleared for GI procedure as a mild to moderate risk Hold aspirin therapy for now until cleared by GI to resume   ECHOCARDIOGRAM COMPLETE  Result Date: 05/19/2020    ECHOCARDIOGRAM REPORT   Patient Name:   STERLIN KNIGHTLY Date of Exam: 05/19/2020 Medical Rec #:  [id number]      Height:       71.0 in Accession #:    [id number]     Weight:       169.8 lb Date of Birth:  01-19-1976      BSA:          1.967 m Patient Age:    68 years       BP:           164/96 mmHg Patient Gender: M              HR:           79 bpm. Exam Location:  [facility] Procedure: 2D Echo, Color Doppler and Cardiac Doppler Indications:     Preoperative clearance  History:         Patient has no prior history of Echocardiogram examinations.                  Risk  Factors:Hypertension. Anxiety, PTSD.  Sonographer:     Sherrie Sport RDCS (AE) Referring Phys:  [id number] [facility] Diagnosing Phys: Yolonda Kida MD IMPRESSIONS  1. Left ventricular ejection fraction, by estimation, is >75%. The left ventricle has hyperdynamic function. The left ventricle has no regional wall motion abnormalities. Left ventricular diastolic parameters were normal.  2. Right ventricular systolic function is normal. The right ventricular size is normal. There is normal pulmonary artery systolic pressure.  3. The mitral valve is normal in structure. Trivial mitral valve regurgitation.  4. The aortic valve is normal in structure. Aortic valve regurgitation is not visualized. FINDINGS  Left Ventricle: Left ventricular ejection fraction, by estimation, is >75%. The left ventricle has hyperdynamic function. The left ventricle has no regional wall motion abnormalities. The left ventricular internal cavity size was normal in size. There is borderline left ventricular hypertrophy. Left ventricular diastolic parameters were normal. Right Ventricle: The right ventricular size is normal. No increase in right ventricular wall thickness. Right ventricular systolic function is normal. There is normal pulmonary artery systolic pressure. The tricuspid regurgitant velocity is 1.98 m/s, and  with an assumed right atrial pressure of 10 mmHg, the estimated right ventricular systolic pressure is 22.0 mmHg. Left Atrium: Left atrial size was normal in size. Right Atrium: Right atrial size was normal in size. Pericardium: There is no evidence of pericardial effusion. Mitral Valve: The mitral valve is normal in structure. Trivial mitral valve regurgitation. Tricuspid Valve: The tricuspid valve is normal in structure. Tricuspid valve regurgitation is trivial. Aortic Valve: The aortic valve is normal in structure. Aortic valve regurgitation is not visualized. Aortic valve mean gradient measures 3.0 mmHg. Aortic valve peak  gradient measures 5.3 mmHg. Aortic valve area, by VTI measures 3.29 cm. Pulmonic Valve: The pulmonic valve was normal in structure. Pulmonic valve regurgitation is not visualized. Aorta: The aortic arch was not well visualized. IAS/Shunts: No atrial level shunt detected by color flow Doppler.  LEFT VENTRICLE PLAX 2D LVIDd:         4.56 cm  Diastology LVIDs:         2.47 cm  LV e' lateral:   9.46 cm/s LV PW:         1.18 cm  LV E/e' lateral: 9.7 LV IVS:        1.38 cm  LV e' medial:    2.83 cm/s LVOT diam:     2.30 cm  LV E/e' medial:  32.3 LV SV:         66 LV SV Index:   34 LVOT Area:     4.15 cm  RIGHT VENTRICLE RV S prime:     15.30 cm/s TAPSE (M-mode): 4.2 cm LEFT ATRIUM             Index       RIGHT ATRIUM           Index LA diam:        4.20 cm 2.14 cm/m  RA Area:     17.00 cm LA Vol (A2C):   77.3 ml 39.30 ml/m RA Volume:   47.50 ml  24.15 ml/m LA Vol (A4C):   47.1 ml 23.95 ml/m LA Biplane Vol: 64.4 ml 32.74 ml/m  AORTIC VALVE                   PULMONIC VALVE AV Area (Vmax):    3.24 cm    PV Vmax:        0.59 m/s AV Area (Vmean):   3.49 cm    PV Peak grad:   1.4 mmHg AV Area (VTI):     3.29 cm  RVOT Peak grad: 2 mmHg AV Vmax:           115.50 cm/s AV Vmean:          75.650 cm/s AV VTI:            0.202 m AV Peak Grad:      5.3 mmHg AV Mean Grad:      3.0 mmHg LVOT Vmax:         90.10 cm/s LVOT Vmean:        63.600 cm/s LVOT VTI:          0.160 m LVOT/AV VTI ratio: 0.79  AORTA Ao Root diam: 3.30 cm MITRAL VALVE               TRICUSPID VALVE MV Area (PHT): 6.54 cm    TR Peak grad:   15.7 mmHg MV Decel Time: 116 msec    TR Vmax:        198.00 cm/s MV E velocity: 91.30 cm/s MV A velocity: 75.00 cm/s  SHUNTS MV E/A ratio:  1.22        Systemic VTI:  0.16 m                            Systemic Diam: 2.30 cm Dwayne Prince Rome MD Electronically signed by Yolonda Kida MD Signature Date/Time: 05/19/2020/4:17:15 PM    Final    DG HIP UNILAT WITH PELVIS 2-3 VIEWS LEFT  Result Date: 05/19/2020 CLINICAL  DATA:  Chronic left groin pain which may be due to remote fall. Initial encounter. EXAM: DG HIP (WITH OR WITHOUT PELVIS) 2-3V LEFT COMPARISON:  CT abdomen and pelvis 09/12/2010. FINDINGS: There is no evidence of hip fracture or dislocation. There is no evidence of arthropathy or other focal bone abnormality. IMPRESSION: Normal exam. Electronically Signed   By: Inge Rise M.D.   On: 05/19/2020 09:25    Assessment & Plan    44 year old male with history of coronary disease with distal disease by cardiac catheterization 2 days ago with medical management recommended, history of anemia with GI bleed.  1.  Coronary artery disease-distal small vessel disease.  Not amenable to PCI.  We will continue to hold enteric-coated aspirin until hemoglobin stable.  Status post colonoscopy.  Continue with atorvastatin 80 mg daily, isosorbide mononitrate 30 mg daily, metoprolol tartrate 25 mg twice daily, and continue to hold aspirin as mentioned above.  Okay for discharge from a cardiac standpoint.  We will schedule follow-up with Dr. Clayborn Bigness in 1 to 2 weeks.  2.  Anemia/GI bleed-status post colonoscopy.  Status post transfusion.  Will hold aspirin until cleared from a GI standpoint.  We will continue to follow hemoglobin.  Patient anxious to go home however plans will be to check hemoglobin in the morning and if stable consider discharge.  Signed, Javier Docker Shunda Rabadi MD 05/22/2020, 9:47 AM  Pager: [phone]

## 2020-05-22 NOTE — Progress Notes (Addendum)
PROGRESS NOTE    Bradley Quinn  XTK:[id number] DOB: 1976-03-15 DOA: 05/18/2020 PCP: Delia Chimes, NP (Inactive)   Brief Narrative:  On 05/18/2020 by Dr. Ivor Costa MUADH Bradley Quinn is a 44 y.o. male with medical history significant of hypertension, GERD, depression, anxiety, PTSD, ADD, who presents with hematemesis, black stool, chest pain and shortness of breath.  Patient states that he has been having intermittent hematemesis for 1 week.  He vomited up 2-3 times of coffee-ground materials.  He has dark tarry stool for about 5 times.  He has epigastric abdominal pain, which is constant, sharp, 7 out of 10 severity, nonradiating.  He had 1 loose stool bowel movement earlier today, but currently no diarrhea.  He also reports chest pain, which is located in left side of chest, sharp, 7 out of 10 severity, radiating to the left axillary area.  It is slightly aggravated by deep breaths. No recent long distant traveling.  Denies fever or chills.  No symptoms of UTI or unilateral weakness.  Patient states that he is taking BC Goody powder at home.  Interim history Admitted with shortness of breath and found to have anemia, received transfusion.  GI consulted. Cardiology consulted for cath and preop assessment. S/p cardiac cath. Pending EGD/colonscopy today.  Assessment & Plan   Hematemesis/ melena/ GI bleed/symptomatic anemia/acute blood loss -Patient presented with shortness of breath along with hematemesis and melena which is been ongoing for the past week -Admits to taking Goody powder approximately 8 doses last Wednesday-for hip pain -Gastroenterology consulted and appreciated-planning for EGD and colonoscopy on 05/22/2020 -Hemoglobin down to 7.8 on admission -Continue IV PPI -Transfuse unit PRBC on 05/21/2020 as hemoglobin dropped to 6.8 -Today 8.4 -Continue to monitor CBC  GERD -Continue PPI  Depression -Continue Prozac  Essential hypertension -Patient currently on no medications at  home, continue IV hydralazine  Chest pain/abnormal EKG- Unstable angina -Patient with atypical chest pain -D-dimer unremarkable -High-sensitivity troponin trended and unremarkable -Echocardiogram EF 75% -Cardiology consulted and appreciated -S/p cardiac catheterization- recommended medical therapy, mild-moderate risk, may proceed with GI procedures -Continue pain control  Left hip pain -Patient does state that he fell a while ago -He has been having hip pain that radiates to his groin -hip x-ray was normal -Continue pain control  ADD -Continue Adderall  THC abuse -UDS positive -Discussed cessation  Headache  -Continue Fioricet PRN  DVT Prophylaxis  SCDs  Code Status: Full  Family Communication: Wife at bedside  Disposition Plan:  Status is: Inpatient  Remains inpatient appropriate because:Ongoing diagnostic testing needed not appropriate for outpatient work up.  Pending GI work-up-EGD and colonoscopy   Dispo: The patient is from: Home              Anticipated d/c is to: Home              Anticipated d/c date is: 1 days              Patient currently is not medically stable to d/c.   Consultants Gastroenterology Cardiology  Procedures  Echocardiogram Cardiac catheterization  Antibiotics   Anti-infectives (From admission, onward)   None      Subjective:   [name] seen and examined today.  Patient feels mildly better today.  Patient denies any further abdominal pain.  Denies any further melena since admission.  Does complain of headache but states that the medications have helped his headache.  Denies current chest pain, shortness of breath, dizziness.  Objective:   Vitals:  05/22/20 0913 05/22/20 0924 05/22/20 0929 05/22/20 0934  BP: 103/66 125/75  116/68  Pulse: 78 (!) 55 67 65  Resp: 16 14 (!) 23 19  Temp: (!) 97.2 F (36.2 C)   (!) 96.8 F (36 C)  TempSrc:      SpO2: 100% 100% 100% 100%  Weight:      Height:        Intake/Output  Summary (Last 24 hours) at 05/22/2020 0936 Last data filed at 05/22/2020 0931 Gross per 24 hour  Intake 3129.78 ml  Output 300 ml  Net 2829.78 ml   Filed Weights   05/21/20 0518 05/22/20 0650 05/22/20 0801  Weight: 77 kg 79.3 kg 79.3 kg   Exam  General: Well developed, well nourished, NAD, appears stated age  87: NCAT, mucous membranes moist.   Cardiovascular: S1 S2 auscultated, RRR, no murmur  Respiratory: Clear to auscultation bilaterally   Abdomen: Soft, nontender, nondistended, + bowel sounds  Extremities: warm dry without cyanosis clubbing or edema  Neuro: AAOx3, nonfocal  Psych: Pleasant, appropriate mood and affect  Data Reviewed: I have personally reviewed following labs and imaging studies  CBC: Recent Labs  Lab 05/19/20 2124 05/19/20 2124 05/20/20 0314 05/20/20 0904 05/21/20 0608 05/21/20 1349 05/22/20 0333  WBC 8.5  --  7.0 7.0 8.5  --  8.9  HGB 9.6*   < > 8.3* 8.3* 7.3* 6.8* 8.4*  HCT 28.2*   < > 25.0* 25.2* 22.0* 21.0* 25.9*  MCV 81.5  --  82.2 82.1 82.4  --  84.4  PLT 235  --  221 214 211  --  192   < > = values in this interval not displayed.   Basic Metabolic Panel: Recent Labs  Lab 05/18/20 1330 05/19/20 0223 05/20/20 0314 05/21/20 0608 05/22/20 0333  NA 139 141 144 140 142  K 3.6 3.4* 3.5 3.7 4.0  CL 102 108 112* 109 111  CO2 28 25 26 24 28   GLUCOSE 115* 97 110* 151* 105*  BUN 14 11 7 9 13   CREATININE 1.15 1.10 1.04 0.97 1.07  CALCIUM 9.3 9.0 8.6* 8.6* 8.7*  MG  --  1.9  --   --   --    GFR: Estimated Creatinine Clearance: 94.8 mL/min (by C-G formula based on SCr of 1.07 mg/dL). Liver Function Tests: Recent Labs  Lab 05/19/20 0223  AST 22  ALT 26  ALKPHOS 51  BILITOT 0.8  PROT 6.8  ALBUMIN 4.1   No results for input(s): LIPASE, AMYLASE in the last 168 hours. No results for input(s): AMMONIA in the last 168 hours. Coagulation Profile: Recent Labs  Lab 05/18/20 2028  INR 1.1   Cardiac Enzymes: No results for  input(s): CKTOTAL, CKMB, CKMBINDEX, TROPONINI in the last 168 hours. BNP (last 3 results) No results for input(s): PROBNP in the last 8760 hours. HbA1C: No results for input(s): HGBA1C in the last 72 hours. CBG: Recent Labs  Lab 05/19/20 0746 05/20/20 0738 05/21/20 0804 05/21/20 1156  GLUCAP 99 101* 123* 103*   Lipid Profile: No results for input(s): CHOL, HDL, LDLCALC, TRIG, CHOLHDL, LDLDIRECT in the last 72 hours. Thyroid Function Tests: No results for input(s): TSH, T4TOTAL, FREET4, T3FREE, THYROIDAB in the last 72 hours. Anemia Panel: No results for input(s): VITAMINB12, FOLATE, FERRITIN, TIBC, IRON, RETICCTPCT in the last 72 hours. Urine analysis:    Component Value Date/Time   COLORURINE YELLOW 07/12/2012 [place] 07/12/2012 0240   LABSPEC 1.018 07/12/2012 0240  PHURINE 6.0 07/12/2012 0240   GLUCOSEU NEGATIVE 07/12/2012 0240   HGBUR NEGATIVE 07/12/2012 0240   BILIRUBINUR NEGATIVE 07/12/2012 0240   KETONESUR NEGATIVE 07/12/2012 0240   PROTEINUR NEGATIVE 07/12/2012 0240   UROBILINOGEN 0.2 07/12/2012 0240   NITRITE NEGATIVE 07/12/2012 0240   LEUKOCYTESUR NEGATIVE 07/12/2012 0240   Sepsis Labs: @LABRCNTIP (procalcitonin:4,lacticidven:4)  ) Recent Results (from the past 240 hour(s))  SARS Coronavirus 2 by RT PCR (hospital order, performed in [facility] hospital lab) Nasopharyngeal Nasopharyngeal Swab     Status: None   Collection Time: 05/18/20  3:23 PM   Specimen: Nasopharyngeal Swab  Result Value Ref Range Status   SARS Coronavirus 2 NEGATIVE NEGATIVE Final    Comment: (NOTE) SARS-CoV-2 target nucleic acids are NOT DETECTED.  The SARS-CoV-2 RNA is generally detectable in upper and lower respiratory specimens during the acute phase of infection. The lowest concentration of SARS-CoV-2 viral copies this assay can detect is 250 copies / mL. A negative result does not preclude SARS-CoV-2 infection and should not be used as the sole basis for  treatment or other patient management decisions.  A negative result may occur with improper specimen collection / handling, submission of specimen other than nasopharyngeal swab, presence of viral mutation(s) within the areas targeted by this assay, and inadequate number of viral copies (<250 copies / mL). A negative result must be combined with clinical observations, patient history, and epidemiological information.  Fact Sheet for Patients:   StrictlyIdeas.no  Fact Sheet for Healthcare Providers: BankingDealers.co.za  This test is not yet approved or  cleared by the [place] FDA and has been authorized for detection and/or diagnosis of SARS-CoV-2 by FDA under an Emergency Use Authorization (EUA).  This EUA will remain in effect (meaning this test can be used) for the duration of the COVID-19 declaration under Section 564(b)(1) of the Act, 21 U.S.C. section 360bbb-3(b)(1), unless the authorization is terminated or revoked sooner.  Performed at [facility], [address], [place], [place] [address]       Radiology Studies: CARDIAC CATHETERIZATION  Result Date: 05/20/2020  Dist Cx-1 lesion is 90% stenosed.  Dist Cx-2 lesion is 90% stenosed.  3rd Mrg lesion is 75% stenosed.  RPAV-1 lesion is 70% stenosed.  RPAV-2 lesion is 90% stenosed.  Mid RCA lesion is 30% stenosed.  Prox RCA lesion is 25% stenosed.  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 lesion relatively small vessel RCA large with minor irregularities proximal and mid PDA was also relatively free of disease but the PL had tandem 70% and 90% lesions in a relatively small vessel No evidence of collaterals  Intervention was deferred Medical therapy was recommended Patient has been cleared for GI procedure as a mild to moderate risk Hold aspirin therapy for now until cleared by GI to resume     Scheduled Meds:  [MAR Hold] amphetamine-dextroamphetamine  30 mg Oral BID   aspirin  81 mg Oral Daily   [MAR Hold] atorvastatin  80 mg Oral Daily   [MAR Hold] FLUoxetine  20 mg Oral Daily   [MAR Hold] isosorbide mononitrate  30 mg Oral Daily   [MAR Hold] metoprolol tartrate  25 mg Oral BID   pantoprazole  40 mg Oral BID AC   [MAR Hold] sodium chloride flush  3 mL Intravenous Q12H   [MAR Hold] sodium chloride flush  3 mL Intravenous Q12H   Continuous Infusions:  [MAR Hold] sodium chloride     sodium chloride 100 mL/hr at 05/22/20 0838   sodium chloride     [MAR Hold] sodium chloride     sodium chloride 20 mL/hr at 05/22/20 3838   sodium chloride       LOS: 3 days   Time Spent in minutes   30 minutes  Megann Easterwood D.O. on 05/22/2020 at 9:36 AM  Between 7am to 7pm - Please see pager noted on [email or web address]  After 7pm go to [email or web address]  And look for the night coverage person covering for me after hours  [facility] Office  [phone]

## 2020-05-22 NOTE — Progress Notes (Signed)
EGD and colonoscopy postprocedure no  EGD revealed clean-based duodenal ulcers Colonoscopy was unremarkable  Recommendations Check H. pylori IgG and treat if positive Avoid NSAID use including BC powder and Goody powder Okay to start aspirin 81 mg or Plavix 75 mg daily given his underlying premature coronary artery disease Recommend Protonix 40 mg p.o. twice daily long-term IV iron as inpatient and discharged home on oral iron Resume diet Follow-up with GI in 4 to 6 weeks  Cephas Darby, MD [place]  [place], [place] [address]  Main: [phone]  Fax: [phone] Pager: [phone]

## 2020-05-22 NOTE — Progress Notes (Signed)
Pt complaining of pain at IV infusion site. Quarter size knot at insertion site of IV in right upper arm. IV infusion stopped and IV removed. IV team consulted for placement of new IV.

## 2020-05-22 NOTE — Transfer of Care (Signed)
Immediate Anesthesia Transfer of Care Note  Patient: Bradley Quinn  Procedure(s) Performed: ESOPHAGOGASTRODUODENOSCOPY (EGD) WITH PROPOFOL (N/A ) COLONOSCOPY WITH PROPOFOL (N/A )  Patient Location: PACU  Anesthesia Type:General  Level of Consciousness: awake  Airway & Oxygen Therapy: Patient Spontanous Breathing and Patient connected to nasal cannula oxygen  Post-op Assessment: Report given to RN  Post vital signs: Reviewed  Last Vitals:  Vitals Value Taken Time  BP 103/66 05/22/20 0914  Temp 36.2 C 05/22/20 0913  Pulse 73 05/22/20 0916  Resp 0 05/22/20 0916  SpO2 100 % 05/22/20 0916  Vitals shown include unvalidated device data.  Last Pain:  Vitals:   05/22/20 0913  TempSrc:   PainSc: 0-No pain      Patients Stated Pain Goal: 0 (05/27/15 0109)  Complications: No complications documented.

## 2020-05-22 NOTE — Op Note (Signed)
[facility] Patient Name: Bradley Quinn Procedure Date: 05/22/2020 8:34 AM MRN: [id number] Account #: [id number] Date of Birth: Sep 12, 1976 Admit Type: Inpatient Age: 44 Room: [facility] ENDO ROOM 4 Gender: Male Note Status: Finalized Procedure:             Colonoscopy Indications:           Melena, Iron deficiency anemia secondary to chronic                         blood loss, Iron deficiency anemia Providers:             Lin Landsman MD, MD Referring MD:          Delia Chimes (Referring MD) Medicines:             Monitored Anesthesia Care Complications:         No immediate complications. Estimated blood loss: None. Procedure:             Pre-Anesthesia Assessment:                        - Prior to the procedure, a History and Physical was                         performed, and patient medications and allergies were                         reviewed. The patient is competent. The risks and                         benefits of the procedure and the sedation options and                         risks were discussed with the patient. All questions                         were answered and informed consent was obtained.                         Patient identification and proposed procedure were                         verified by the physician, the nurse, the                         anesthesiologist, the anesthetist and the technician                         in the pre-procedure area in the procedure room in the                         endoscopy suite. Mental Status Examination: alert and                         oriented. Airway Examination: normal oropharyngeal                         airway and neck mobility. Respiratory Examination:  clear to auscultation. CV Examination: normal.                         Prophylactic Antibiotics: The patient does not require                         prophylactic antibiotics. Prior Anticoagulants:  The                         patient has taken no previous anticoagulant or                         antiplatelet agents. ASA Grade Assessment: III - A                         patient with severe systemic disease. After reviewing                         the risks and benefits, the patient was deemed in                         satisfactory condition to undergo the procedure. The                         anesthesia plan was to use monitored anesthesia care                         (MAC). Immediately prior to administration of                         medications, the patient was re-assessed for adequacy                         to receive sedatives. The heart rate, respiratory                         rate, oxygen saturations, blood pressure, adequacy of                         pulmonary ventilation, and response to care were                         monitored throughout the procedure. The physical                         status of the patient was re-assessed after the                         procedure.                        After obtaining informed consent, the colonoscope was                         passed under direct vision. Throughout the procedure,                         the patient's blood pressure, pulse, and oxygen  saturations were monitored continuously. The                         Colonoscope was introduced through the anus and                         advanced to the the cecum, identified by appendiceal                         orifice and ileocecal valve. The colonoscopy was                         performed without difficulty. The patient tolerated                         the procedure well. The quality of the bowel                         preparation was fair. Findings:      The entire examined colon appeared normal.      The retroflexed view of the distal rectum and anal verge was normal and       showed no anal or rectal abnormalities. Impression:             - Preparation of the colon was fair.                        - The entire examined colon is normal.                        - The distal rectum and anal verge are normal on                         retroflexion view.                        - No specimens collected. Recommendation:        - Return patient to hospital ward for possible                         discharge same day.                        - Resume regular diet today.                        - Continue present medications.                        - Return to GI office in 1 month. Procedure Code(s):     --- Professional ---                        [phone], Colonoscopy, flexible; diagnostic, including                         collection of specimen(s) by brushing or washing, when                         performed (separate procedure) Diagnosis Code(s):     --- Professional ---  K92.1, Melena (includes Hematochezia)                        D50.0, Iron deficiency anemia secondary to blood loss                         (chronic)                        D50.9, Iron deficiency anemia, unspecified CPT copyright 2019 [employer]. All rights reserved. The codes documented in this report are preliminary and upon coder review may  be revised to meet current compliance requirements. Dr. Ulyess Mort Lin Landsman MD, MD 05/22/2020 9:09:02 AM This report has been signed electronically. Number of Addenda: 0 Note Initiated On: 05/22/2020 8:34 AM Scope Withdrawal Time: 0 hours 5 minutes 21 seconds  Total Procedure Duration: 0 hours 7 minutes 48 seconds  Estimated Blood Loss:  Estimated blood loss: none.      [facility]

## 2020-05-22 NOTE — Op Note (Signed)
[facility]astroenter[facility] Patient Name: Bradley Quinn Procedure Date: 05/22/2020 8:35 AM MRN: [id number] Account #: [id number] Date of Birth: 18-Jun-1976 Admit Type: Inpatient Age: 44 Room: [facility] ENDO ROOM 4 Gender: Male Note Status: Finalized Procedure:             Upper GI endoscopy Indications:           Hematemesis, Melena Providers:             Lin Landsman MD, MD Referring MD:          Delia Chimes (Referring MD) Medicines:             Monitored Anesthesia Care Complications:         No immediate complications. Estimated blood loss: None. Procedure:             Pre-Anesthesia Assessment:                        - Prior to the procedure, a History and Physical was                         performed, and patient medications and allergies were                         reviewed. The patient is competent. The risks and                         benefits of the procedure and the sedation options and                         risks were discussed with the patient. All questions                         were answered and informed consent was obtained.                         Patient identification and proposed procedure were                         verified by the physician, the nurse, the                         anesthesiologist, the anesthetist and the technician                         in the pre-procedure area in the procedure room in the                         endoscopy suite. Mental Status Examination: alert and                         oriented. Airway Examination: normal oropharyngeal                         airway and neck mobility. Respiratory Examination:                         clear to auscultation. CV Examination: normal.  Prophylactic Antibiotics: The patient does not require                         prophylactic antibiotics. Prior Anticoagulants: The                         patient has taken no previous anticoagulant or                          antiplatelet agents. ASA Grade Assessment: III - A                         patient with severe systemic disease. After reviewing                         the risks and benefits, the patient was deemed in                         satisfactory condition to undergo the procedure. The                         anesthesia plan was to use monitored anesthesia care                         (MAC). Immediately prior to administration of                         medications, the patient was re-assessed for adequacy                         to receive sedatives. The heart rate, respiratory                         rate, oxygen saturations, blood pressure, adequacy of                         pulmonary ventilation, and response to care were                         monitored throughout the procedure. The physical                         status of the patient was re-assessed after the                         procedure.                        After obtaining informed consent, the endoscope was                         passed under direct vision. Throughout the procedure,                         the patient's blood pressure, pulse, and oxygen                         saturations were monitored continuously. The Endoscope  was introduced through the mouth, and advanced to the                         second part of duodenum. The upper GI endoscopy was                         accomplished without difficulty. The patient tolerated                         the procedure well. Findings:      Localized nodular mucosa was found in the second portion of the       duodenum. Biopsies were taken with a cold forceps for histology.      Two non-bleeding superficial duodenal ulcers with a clean ulcer base       (Forrest Class III) were found in the duodenal bulb. The largest lesion       was 5 mm in largest dimension.      The entire examined stomach was normal.      The cardia and gastric  fundus were normal on retroflexion.      A single area of ectopic gastric mucosa was found in the upper third of       the esophagus, 16 to 20 cm from the incisors. Impression:            - Nodular mucosa in the second portion of the                         duodenum. Biopsied.                        - Non-bleeding duodenal ulcers with a clean ulcer base                         (Forrest Class III).                        - Normal stomach.                        - Ectopic gastric mucosa in the upper third of the                         esophagus. Recommendation:        - Await pathology results.                        - Use Prilosec (omeprazole) 40 mg PO BID for 2 months.                        - Perform an H. pylori serology today.                        - Proceed with colonoscopy as scheduled                        See colonoscopy report                        - No ibuprofen, naproxen, or other non-steroidal  anti-inflammatory drugs. Procedure Code(s):     --- Professional ---                        [phone], Esophagogastroduodenoscopy, flexible,                         transoral; with biopsy, single or multiple Diagnosis Code(s):     --- Professional ---                        K31.89, Other diseases of stomach and duodenum                        K26.9, Duodenal ulcer, unspecified as acute or                         chronic, without hemorrhage or perforation                        Q40.2, Other specified congenital malformations of                         stomach                        K92.0, Hematemesis                        K92.1, Melena (includes Hematochezia) CPT copyright 2019 American Medical Association. All rights reserved. The codes documented in this report are preliminary and upon coder review may  be revised to meet current compliance requirements. Dr. Ulyess Mort Lin Landsman MD, MD 05/22/2020 8:53:56 AM This report has been signed  electronically. Number of Addenda: 0 Note Initiated On: 05/22/2020 8:35 AM Estimated Blood Loss:  Estimated blood loss: none.      [facility]

## 2020-05-22 NOTE — Anesthesia Postprocedure Evaluation (Signed)
Anesthesia Post Note  Patient: Bradley Quinn  Procedure(s) Performed: ESOPHAGOGASTRODUODENOSCOPY (EGD) WITH PROPOFOL (N/A ) COLONOSCOPY WITH PROPOFOL (N/A )  Patient location during evaluation: PACU Anesthesia Type: General Level of consciousness: awake and alert Pain management: pain level controlled Vital Signs Assessment: post-procedure vital signs reviewed and stable Respiratory status: spontaneous breathing, nonlabored ventilation, respiratory function stable and patient connected to nasal cannula oxygen Cardiovascular status: blood pressure returned to baseline and stable Postop Assessment: no apparent nausea or vomiting Anesthetic complications: no   No complications documented.   Last Vitals:  Vitals:   05/22/20 0913 05/22/20 0924  BP: 103/66 125/75  Pulse: 78 (!) 55  Resp: 16 14  Temp: (!) 36.2 C   SpO2: 100% 100%    Last Pain:  Vitals:   05/22/20 0924  TempSrc:   PainSc: 0-No pain                 Arita Miss

## 2020-05-22 NOTE — Anesthesia Preprocedure Evaluation (Addendum)
Anesthesia Evaluation  Patient identified by MRN, date of birth, ID band Patient awake    Reviewed: Allergy & Precautions, NPO status , Patient's Chart, lab work & pertinent test results  History of Anesthesia Complications Negative for: history of anesthetic complications  Airway Mallampati: III  TM Distance: >3 FB Neck ROM: Full    Dental  (+) Poor Dentition, Missing, Chipped, Dental Advisory Given None loose:   Pulmonary neg pulmonary ROS, neg sleep apnea, neg COPD, Patient abstained from smoking.Not current smoker,    Pulmonary exam normal breath sounds clear to auscultation       Cardiovascular Exercise Tolerance: Good METShypertension, Pt. on medications + CAD  (-) Past MI (-) dysrhythmias  Rhythm:Regular Rate:Normal - Systolic murmurs TTE unremarkable. LHC 05/20/20: 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 lesion relatively small vessel RCA large with minor irregularities proximal and mid PDA was also relatively free of disease but the PL had tandem 70% and 90% lesions in a relatively small vessel No evidence of collaterals Intervention was deferred Medical therapy was recommended Patient has been cleared for GI procedure as a mild to moderate risk Hold aspirin therapy for now until cleared by GI to resume    Neuro/Psych PSYCHIATRIC DISORDERS Anxiety Depression negative neurological ROS     GI/Hepatic GERD  ,(+)     (-) substance abuse  ,   Endo/Other  neg diabetes  Renal/GU negative Renal ROS     Musculoskeletal   Abdominal   Peds  Hematology  (+) anemia ,   Anesthesia Other Findings Past Medical History: No date: ADD (attention deficit  disorder) No date: Anxiety No date: GERD (gastroesophageal reflux disease) No date: Hypertension No date: PTSD (post-traumatic stress disorder)  Reproductive/Obstetrics                            Anesthesia Physical Anesthesia Plan  ASA: III  Anesthesia Plan: General   Post-op Pain Management:    Induction: Intravenous  PONV Risk Score and Plan: 2 and Ondansetron, Propofol infusion and TIVA  Airway Management Planned: Nasal Cannula  Additional Equipment: None  Intra-op Plan:   Post-operative Plan:   Informed Consent: I have reviewed the patients History and Physical, chart, labs and discussed the procedure including the risks, benefits and alternatives for the proposed anesthesia with the patient or authorized representative who has indicated his/her understanding and acceptance.     Dental advisory given  Plan Discussed with: CRNA and Surgeon  Anesthesia Plan Comments: (Discussed risks of anesthesia with patient, including possibility of difficulty with spontaneous ventilation under anesthesia necessitating airway intervention, PONV, and rare risks such as cardiac or respiratory or neurological events. Patient understands. Patient counseled on being higher risk for anesthesia due to comorbidities: coronary disease (granted, they are distal lesions and cardiologist would not intervene at this visit, but in future). Patient was told about increased risk of cardiac and respiratory events, including death. Patient understands. )        Anesthesia Quick Evaluation

## 2020-05-23 LAB — GLUCOSE, CAPILLARY: Glucose-Capillary: 92 mg/dL (ref 70–99)

## 2020-05-23 LAB — HEMOGLOBIN AND HEMATOCRIT, BLOOD
HCT: 26.2 % — ABNORMAL LOW (ref 39.0–52.0)
Hemoglobin: 8.7 g/dL — ABNORMAL LOW (ref 13.0–17.0)

## 2020-05-23 MED ORDER — BUTALBITAL-APAP-CAFFEINE 50-325-40 MG PO TABS: 1.0000 | ORAL_TABLET | Freq: Four times a day (QID) | ORAL | 0 refills | Status: DC | PRN

## 2020-05-23 MED ORDER — ATORVASTATIN CALCIUM 80 MG PO TABS: 80.0000 mg | ORAL_TABLET | Freq: Every day | ORAL | 0 refills | Status: DC

## 2020-05-23 MED ORDER — METOPROLOL TARTRATE 25 MG PO TABS: 25.0000 mg | ORAL_TABLET | Freq: Two times a day (BID) | ORAL | 0 refills | Status: AC

## 2020-05-23 MED ORDER — ASPIRIN EC 81 MG PO TBEC
81.0000 mg | DELAYED_RELEASE_TABLET | Freq: Every day | ORAL | 0 refills | Status: AC
Start: 2020-05-23 — End: 2021-05-23

## 2020-05-23 MED ORDER — PANTOPRAZOLE SODIUM 40 MG PO TBEC: 40.0000 mg | DELAYED_RELEASE_TABLET | Freq: Two times a day (BID) | ORAL | 0 refills | Status: AC

## 2020-05-23 MED ORDER — ISOSORBIDE MONONITRATE ER 30 MG PO TB24: 30.0000 mg | ORAL_TABLET | Freq: Every day | ORAL | 0 refills | Status: AC

## 2020-05-23 NOTE — Progress Notes (Addendum)
Patient Name: Bradley Quinn Date of Encounter: 05/23/2020  Hospital Problem List     Principal Problem:   Hematemesis Active Problems:   GERD (gastroesophageal reflux disease)   Depression   HTN (hypertension)   Symptomatic anemia   Acute blood loss anemia   Chest pain   ADD (attention deficit disorder)    Patient Profile        44 year old male with history of hypertension, coronary artery disease, gastroesophageal reflux disease admitted with GI bleed and anemia.  Underwent endoscopy this morning.  Hemoglobin 6.8 yesterday.  Received a unit of blood.  Hemoglobin increased to 8.4.  Was on enteric-coated aspirin which is been held.  Ruled out for myocardial infarction.  Cardiac cath revealed 90% distal left circumflex, 75% third marginal, 90% right posterior AV groove, EF 70%, medical management recommended due to no high-grade proximal disease with distal vessel disease only  Subjective   Doing well. HGB stable   Inpatient Medications    . amphetamine-dextroamphetamine  30 mg Oral BID  . aspirin  81 mg Oral Daily  . atorvastatin  80 mg Oral Daily  . FLUoxetine  20 mg Oral Daily  . isosorbide mononitrate  30 mg Oral Daily  . metoprolol tartrate  25 mg Oral BID  . pantoprazole  40 mg Oral BID AC  . sodium chloride flush  3 mL Intravenous Q12H  . sodium chloride flush  3 mL Intravenous Q12H    Vital Signs    Vitals:   05/23/20 0035 05/23/20 0114 05/23/20 0348 05/23/20 0806  BP: (!) 179/100 (!) 152/88 134/83 (!) 150/91  Pulse: 81 89 75 75  Resp:  20 20 17   Temp:   98.3 F (36.8 C) 98.7 F (37.1 C)  TempSrc:   Oral Oral  SpO2: 100% 100% 99% 100%  Weight:   80.3 kg   Height:        Intake/Output Summary (Last 24 hours) at 05/23/2020 1040 Last data filed at 05/23/2020 0808 Gross per 24 hour  Intake 103 ml  Output 3400 ml  Net -3297 ml   Filed Weights   05/22/20 0650 05/22/20 0801 05/23/20 0348  Weight: 79.3 kg 79.3 kg 80.3 kg    Physical Exam    GEN:  Well nourished, well developed, in no acute distress.  HEENT: normal.  Neck: Supple, no JVD, carotid bruits, or masses. Cardiac: RRR, no murmurs, rubs, or gallops. No clubbing, cyanosis, edema.  Radials/DP/PT 2+ and equal bilaterally.  Respiratory:  Respirations regular and unlabored, clear to auscultation bilaterally. GI: Soft, nontender, nondistended, BS + x 4. MS: no deformity or atrophy. Skin: warm and dry, no rash. Neuro:  Strength and sensation are intact. Psych: Normal affect.  Labs    CBC Recent Labs    05/21/20 0608 05/21/20 1349 05/22/20 0333 05/23/20 0500  WBC 8.5  --  8.9  --   HGB 7.3*   < > 8.4* 8.7*  HCT 22.0*   < > 25.9* 26.2*  MCV 82.4  --  84.4  --   PLT 211  --  192  --    < > = values in this interval not displayed.   Basic Metabolic Panel Recent Labs    05/21/20 0608 05/22/20 0333  NA 140 142  K 3.7 4.0  CL 109 111  CO2 24 28  GLUCOSE 151* 105*  BUN 9 13  CREATININE 0.97 1.07  CALCIUM 8.6* 8.7*   Liver Function Tests No results for input(s): AST, ALT,  ALKPHOS, BILITOT, PROT, ALBUMIN in the last 72 hours. No results for input(s): LIPASE, AMYLASE in the last 72 hours. Cardiac Enzymes No results for input(s): CKTOTAL, CKMB, CKMBINDEX, TROPONINI in the last 72 hours. BNP No results for input(s): BNP in the last 72 hours. D-Dimer No results for input(s): DDIMER in the last 72 hours. Hemoglobin A1C No results for input(s): HGBA1C in the last 72 hours. Fasting Lipid Panel No results for input(s): CHOL, HDL, LDLCALC, TRIG, CHOLHDL, LDLDIRECT in the last 72 hours. Thyroid Function Tests No results for input(s): TSH, T4TOTAL, T3FREE, THYROIDAB in the last 72 hours.  Invalid input(s): FREET3  Telemetry    nsr  ECG    nsr  Radiology    DG Chest 2 View  Result Date: 05/18/2020 CLINICAL DATA:  Chest pain and shortness of breath for 1 week EXAM: CHEST - 2 VIEW COMPARISON:  09/27/2010 FINDINGS: The heart size and mediastinal contours are  within normal limits. Both lungs are clear. The visualized skeletal structures are unremarkable. IMPRESSION: No active cardiopulmonary disease. Electronically Signed   By: Inez Catalina M.D.   On: 05/18/2020 14:06   CARDIAC CATHETERIZATION  Result Date: 05/20/2020  Dist Cx-1 lesion is 90% stenosed.  Dist Cx-2 lesion is 90% stenosed.  3rd Mrg lesion is 75% stenosed.  RPAV-1 lesion is 70% stenosed.  RPAV-2 lesion is 90% stenosed.  Mid RCA lesion is 30% stenosed.  Prox RCA lesion is 25% stenosed.  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 lesion relatively small vessel RCA large with minor irregularities proximal and mid PDA was also relatively free of disease but the PL had tandem 70% and 90% lesions in a relatively small vessel No evidence of collaterals Intervention was deferred Medical therapy was recommended Patient has been cleared for GI procedure as a mild to moderate risk Hold aspirin therapy for now until cleared by GI to resume   ECHOCARDIOGRAM COMPLETE  Result Date: 05/19/2020    ECHOCARDIOGRAM REPORT   Patient Name:   Bradley Quinn Date of Exam: 05/19/2020 Medical Rec #:  [id number]      Height:       71.0 in Accession #:    [id number]     Weight:       169.8 lb Date of Birth:  1976/03/08      BSA:          1.967 m Patient Age:    78 years       BP:           164/96 mmHg Patient Gender: M              HR:           79 bpm. Exam Location:  [facility] Procedure: 2D Echo, Color Doppler and Cardiac Doppler Indications:     Preoperative clearance  History:         Patient has no prior history of Echocardiogram examinations.                  Risk Factors:Hypertension. Anxiety, PTSD.  Sonographer:     Sherrie Sport RDCS (AE) Referring Phys:  [id number]  [facility] Diagnosing Phys: Yolonda Kida MD IMPRESSIONS  1. Left ventricular ejection fraction, by estimation, is >75%. The left ventricle has  hyperdynamic function. The left ventricle has no regional wall motion abnormalities. Left ventricular diastolic parameters were normal.  2. Right ventricular systolic function is normal. The right ventricular size is normal. There is normal pulmonary artery systolic pressure.  3. The mitral valve is normal in structure. Trivial mitral valve regurgitation.  4. The aortic valve is normal in structure. Aortic valve regurgitation is not visualized. FINDINGS  Left Ventricle: Left ventricular ejection fraction, by estimation, is >75%. The left ventricle has hyperdynamic function. The left ventricle has no regional wall motion abnormalities. The left ventricular internal cavity size was normal in size. There is borderline left ventricular hypertrophy. Left ventricular diastolic parameters were normal. Right Ventricle: The right ventricular size is normal. No increase in right ventricular wall thickness. Right ventricular systolic function is normal. There is normal pulmonary artery systolic pressure. The tricuspid regurgitant velocity is 1.98 m/s, and  with an assumed right atrial pressure of 10 mmHg, the estimated right ventricular systolic pressure is 87.6 mmHg. Left Atrium: Left atrial size was normal in size. Right Atrium: Right atrial size was normal in size. Pericardium: There is no evidence of pericardial effusion. Mitral Valve: The mitral valve is normal in structure. Trivial mitral valve regurgitation. Tricuspid Valve: The tricuspid valve is normal in structure. Tricuspid valve regurgitation is trivial. Aortic Valve: The aortic valve is normal in structure. Aortic valve regurgitation is not visualized. Aortic valve mean gradient measures 3.0 mmHg. Aortic valve peak gradient measures 5.3 mmHg. Aortic valve area, by VTI measures 3.29 cm. Pulmonic Valve: The  pulmonic valve was normal in structure. Pulmonic valve regurgitation is not visualized. Aorta: The aortic arch was not well visualized. IAS/Shunts: No atrial level shunt detected by color flow Doppler.  LEFT VENTRICLE PLAX 2D LVIDd:         4.56 cm  Diastology LVIDs:         2.47 cm  LV e' lateral:   9.46 cm/s LV PW:         1.18 cm  LV E/e' lateral: 9.7 LV IVS:        1.38 cm  LV e' medial:    2.83 cm/s LVOT diam:     2.30 cm  LV E/e' medial:  32.3 LV SV:         66 LV SV Index:   34 LVOT Area:     4.15 cm  RIGHT VENTRICLE RV S prime:     15.30 cm/s TAPSE (M-mode): 4.2 cm LEFT ATRIUM             Index       RIGHT ATRIUM           Index LA diam:        4.20 cm 2.14 cm/m  RA Area:     17.00 cm LA Vol (A2C):   77.3 ml 39.30 ml/m RA Volume:   47.50 ml  24.15 ml/m LA Vol (A4C):   47.1 ml 23.95 ml/m LA Biplane Vol: 64.4 ml 32.74 ml/m  AORTIC VALVE                   PULMONIC VALVE AV Area (Vmax):    3.24 cm    PV Vmax:        0.59 m/s AV Area (Vmean):   3.49 cm    PV Peak grad:   1.4 mmHg AV Area (VTI):     3.29 cm    RVOT Peak grad: 2 mmHg AV Vmax:  115.50 cm/s AV Vmean:          75.650 cm/s AV VTI:            0.202 m AV Peak Grad:      5.3 mmHg AV Mean Grad:      3.0 mmHg LVOT Vmax:         90.10 cm/s LVOT Vmean:        63.600 cm/s LVOT VTI:          0.160 m LVOT/AV VTI ratio: 0.79  AORTA Ao Root diam: 3.30 cm MITRAL VALVE               TRICUSPID VALVE MV Area (PHT): 6.54 cm    TR Peak grad:   15.7 mmHg MV Decel Time: 116 msec    TR Vmax:        198.00 cm/s MV E velocity: 91.30 cm/s MV A velocity: 75.00 cm/s  SHUNTS MV E/A ratio:  1.22        Systemic VTI:  0.16 m                            Systemic Diam: 2.30 cm Dwayne Prince Rome MD Electronically signed by Yolonda Kida MD Signature Date/Time: 05/19/2020/4:17:15 PM    Final    DG HIP UNILAT WITH PELVIS 2-3 VIEWS LEFT  Result Date: 05/19/2020 CLINICAL DATA:  Chronic left groin pain which may be due to remote fall. Initial encounter. EXAM: DG  HIP (WITH OR WITHOUT PELVIS) 2-3V LEFT COMPARISON:  CT abdomen and pelvis 09/12/2010. FINDINGS: There is no evidence of hip fracture or dislocation. There is no evidence of arthropathy or other focal bone abnormality. IMPRESSION: Normal exam. Electronically Signed   By: Inge Rise M.D.   On: 05/19/2020 09:25    Assessment & Plan      44 year old male with history of coronary disease with distal disease by cardiac catheterization 2 days ago with medical management recommended, history of anemia with GI bleed.  1.  Coronary artery disease-distal small vessel disease.  Not amenable to PCI.    Status post colonoscopy.  Continue with atorvastatin 80 mg daily, isosorbide mononitrate 30 mg daily, metoprolol tartrate 25 mg twice daily, and asa 81 mg daily.   Okay for discharge from a cardiac standpoint.  We will schedule follow-up with Dr. Clayborn Bigness in 1 to 2 weeks.  2.  Anemia/GI bleed-status post colonoscopy.  Status post transfusion.  OK for asa. No plavix  Signed, Javier Docker. Laiklynn Raczynski MD 05/23/2020, 10:40 AM  Pager: [phone]

## 2020-05-23 NOTE — Progress Notes (Signed)
Patient discharged home as per order, Discharged instruction provided, iv removed tele removed

## 2020-05-23 NOTE — Discharge Summary (Signed)
Physician Discharge Summary  Bradley Quinn WNU:[id number] DOB: [birth year] DOA: 05/18/2020  PCP: Alvester Chou, NP  Admit date: 05/18/2020 Discharge date: 05/23/2020  Time spent: 45 minutes  Recommendations for Outpatient Follow-up:  Patient will be discharged to home.  Patient will need to follow up with primary care provider within one week of discharge, repeat CBC. Follow up with [facility]ilogy, Dr. Clayborn Bigness in 2 weeks. Follow up with gastroenterology in 4-6 weeks.  Patient should continue medications as prescribed.  Patient should follow a heart healthy diet.    Discharge Diagnoses:  Hematemesis/ melena/ GI bleed/symptomatic anemia/acute blood loss GERD Depression Essential hypertension Chest pain/abnormal EKG- Unstable angina Left hip pain ADD THC abuse Headache   Discharge Condition: Stable  Diet recommendation: heart healthy  Filed Weights   05/22/20 0650 05/22/20 0801 05/23/20 0348  Weight: 79.3 kg 79.3 kg 80.3 kg    History of present illness:  On 05/18/2020 by Dr. Mariann Barter Draperis a 44 y.o.malewith medical history significant ofhypertension, GERD, depression, anxiety, PTSD, ADD, who presents with hematemesis, black stool, chest pain and shortness of breath.  Patient states that he has been having intermittent hematemesisfor1 week. He vomited up 2-3 times of coffee-ground materials.He has dark tarry stool for about5 times. He has epigastric abdominal pain, which is constant, sharp, 7 out of 10 severity, nonradiating. He had 1 loose stool bowel movement earlier today, but currently no diarrhea. He also reports chest pain, which is located in left side of chest, sharp, 7 out of 10 severity, radiating to the left axillary area.It is slightly aggravated by deep breaths. No recent long distant traveling. Denies fever or chills. No symptoms of UTI or unilateral weakness. Patient states that he is taking BC Goody powder at home.  Hospital Course:   Hematemesis/ melena/ GI bleed/symptomatic anemia/acute blood loss -Patient presented with shortness of breath along with hematemesis and melena which is been ongoing for the past week -Admits to taking Goody powder approximately 8 doses last Wednesday-for hip pain -Gastroenterology consulted and appreciated -Hemoglobin down to 7.8 on admission -was placed on Protonix drip -Received 2u PRBC this admission -Today hemoglobin is stable, 8.7 -EGD: Nodular mucosa in the second portion of the duodenum, biopsied.  Nonbleeding duodenal ulcers with a clean base (Forrest class III).  Ectopic gastric mucosa in the upper third of the esophagus.  Biopsy results pending.  Recommendation of PPI twice daily.  H. pylori serology pending -Colonoscopy: Was examined and normal. -Gastroenterology recommending Protonix 40 mg twice daily long-term, avoidance of NSAIDs.  Okay to start aspirin or Plavix.  Follow-up with GI in 4 to 6 weeks. -Repeat CBC in one week  GERD -Continue PPI  Depression -Continue Prozac  Essential hypertension -Patient currently on no medications at home, continue IV hydralazine  Chest pain/abnormal EKG- Unstable angina -Patient with atypical chest pain -D-dimer unremarkable -High-sensitivity troponin trended and unremarkable -Echocardiogram EF 75% -Cardiology consulted and appreciated -S/p cardiac catheterization- recommended medical therapy, mild-moderate risk, may proceed with GI procedures -Continue pain control -Will place patient on aspirin now that colonoscopy and EGD have been done and okay with gastroenterology -Continue atorvastatin 80 mg daily, Imdur 30 mg daily, metoprolol 25 mg twice daily.  -Follow-up with Dr. Clayborn Bigness within 1 to 2 weeks.  Left hip pain -Resolved -Patient does state that he fell a while ago -He has been having hip pain that radiates to his groin -hip x-ray was normal -Continue pain control  ADD -Continue Adderall  THC abuse -UDS  positive  Headache  -Continue Fioricet PRN  Consultants Gastroenterology Cardiology  Procedures  Echocardiogram Cardiac catheterization EGD Colonoscopy  Discharge Exam: Vitals:   05/23/20 0348 05/23/20 0806  BP: 134/83 (!) 150/91  Pulse: 75 75  Resp: 20 17  Temp: 98.3 F (36.8 C) 98.7 F (37.1 C)  SpO2: 99% 100%     General: Well developed, well nourished, NAD, appears stated age  HEENT: NCAT, mucous membranes moist.  Cardiovascular: S1 S2 auscultated, RRR, no murmur  Respiratory: Clear to auscultation bilaterally   Abdomen: Soft, nontender, nondistended, + bowel sounds  Extremities: warm dry without cyanosis clubbing or edema  Neuro: AAOx3, nonfocal   Psych: Normal affect and demeanor with intact judgement and insight, pleasant  Discharge Instructions Discharge Instructions    Discharge instructions   Complete by: As directed    Patient will be discharged to home.  Patient will need to follow up with primary care provider within one week of discharge, repeat CBC. Follow up with [facility], Dr. Clayborn Bigness in 2 weeks. Follow up with gastroenterology in 4-6 weeks.  Patient should continue medications as prescribed.  Patient should follow a heart healthy diet.     Allergies as of 05/23/2020      Reactions   Iohexol     Code: RASH, Desc: Itching 5 minutes after omnipaque injection., Onset Date: [id number]      Medication List    TAKE these medications   amphetamine-dextroamphetamine 15 MG tablet Commonly known as: ADDERALL Take 2 tablets by mouth 2 (two) times daily.   aspirin EC 81 MG tablet Take 1 tablet (81 mg total) by mouth daily. Swallow whole.   atorvastatin 80 MG tablet Commonly known as: LIPITOR Take 1 tablet (80 mg total) by mouth at bedtime.   butalbital-acetaminophen-caffeine 50-325-40 MG tablet Commonly known as: FIORICET Take 1 tablet by mouth every 6 (six) hours as needed for headache.   FLUoxetine 20 MG capsule Commonly known as:  PROZAC Take 20 mg by mouth daily.   isosorbide mononitrate 30 MG 24 hr tablet Commonly known as: IMDUR Take 1 tablet (30 mg total) by mouth daily.   metoprolol tartrate 25 MG tablet Commonly known as: LOPRESSOR Take 1 tablet (25 mg total) by mouth 2 (two) times daily.   pantoprazole 40 MG tablet Commonly known as: PROTONIX Take 1 tablet (40 mg total) by mouth 2 (two) times daily before a meal.      Allergies  Allergen Reactions  . Iohexol      Code: RASH, Desc: Itching 5 minutes after omnipaque injection., Onset Date: [id number]     Follow-up Information    Lujean Amel D, MD Follow up in 1 week(s).   Specialties: Cardiology, Internal Medicine Contact information: [place] [address] [phone]        Yolonda Kida, MD .   Specialties: Cardiology, Internal Medicine Contact information: [address] [place] [address] [place], [place], [place]. Schedule an appointment as soon as possible for a visit in 1 week(s).   Specialty: Nurse Practitioner Why: Hospital follow up Contact information: [address] [place] [address] [phone]                The results of significant diagnostics from this hospitalization (including imaging, microbiology, ancillary and laboratory) are listed below for reference.    Significant Diagnostic Studies: DG Chest 2 View  Result Date: 05/18/2020 CLINICAL DATA:  Chest pain and shortness of breath for  1 week EXAM: CHEST - 2 VIEW COMPARISON:  09/27/2010 FINDINGS: The heart size and mediastinal contours are within normal limits. Both lungs are clear. The visualized skeletal structures are unremarkable. IMPRESSION: No active cardiopulmonary disease. Electronically Signed   By: Inez Catalina M.D.   On: 05/18/2020 14:06   CARDIAC CATHETERIZATION  Result Date: 05/20/2020  Dist Cx-1 lesion is 90% stenosed.  Dist Cx-2 lesion is 90% stenosed.  3rd Mrg lesion is 75% stenosed.   RPAV-1 lesion is 70% stenosed.  RPAV-2 lesion is 90% stenosed.  Mid RCA lesion is 30% stenosed.  Prox RCA lesion is 25% stenosed.  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 lesion relatively small vessel RCA large with minor irregularities proximal and mid PDA was also relatively free of disease but the PL had tandem 70% and 90% lesions in a relatively small vessel No evidence of collaterals Intervention was deferred Medical therapy was recommended Patient has been cleared for GI procedure as a mild to moderate risk Hold aspirin therapy for now until cleared by GI to resume   ECHOCARDIOGRAM COMPLETE  Result Date: 05/19/2020    ECHOCARDIOGRAM REPORT   Patient Name:   Bradley Quinn Date of Exam: 05/19/2020 Medical Rec #:  [id number]      Height:       71.0 in Accession #:    [id number]     Weight:       169.8 lb Date of Birth:  1976/02/28      BSA:          1.967 m Patient Age:    25 years       BP:           164/96 mmHg Patient Gender: M              HR:           79 bpm. Exam Location:  [facility] Procedure: 2D Echo, Color Doppler and Cardiac Doppler Indications:     Preoperative clearance  History:         Patient has no prior history of Echocardiogram examinations.                  Risk Factors:Hypertension. Anxiety, PTSD.  Sonographer:     Sherrie Sport RDCS (AE) Referring Phys:  [id number] [facility] Negan Grudzien Diagnosing Phys: Yolonda Kida MD IMPRESSIONS  1. Left ventricular ejection fraction, by estimation, is >75%. The left ventricle has hyperdynamic function. The left ventricle has no regional wall motion abnormalities. Left ventricular diastolic parameters were normal.  2. Right ventricular systolic function is normal. The right ventricular size is normal.  There is normal pulmonary artery systolic pressure.  3. The mitral valve is normal in structure. Trivial mitral valve regurgitation.  4. The aortic valve is normal in structure. Aortic valve regurgitation is not visualized. FINDINGS  Left Ventricle: Left ventricular ejection fraction, by estimation, is >75%. The left ventricle has hyperdynamic function. The left ventricle has no regional wall motion abnormalities. The left ventricular internal cavity size was normal in size. There is borderline left ventricular hypertrophy. Left ventricular diastolic parameters were normal. Right Ventricle: The right ventricular size is normal. No increase in right ventricular wall thickness. Right ventricular systolic function is normal. There is normal pulmonary artery systolic  pressure. The tricuspid regurgitant velocity is 1.98 m/s, and  with an assumed right atrial pressure of 10 mmHg, the estimated right ventricular systolic pressure is 01.7 mmHg. Left Atrium: Left atrial size was normal in size. Right Atrium: Right atrial size was normal in size. Pericardium: There is no evidence of pericardial effusion. Mitral Valve: The mitral valve is normal in structure. Trivial mitral valve regurgitation. Tricuspid Valve: The tricuspid valve is normal in structure. Tricuspid valve regurgitation is trivial. Aortic Valve: The aortic valve is normal in structure. Aortic valve regurgitation is not visualized. Aortic valve mean gradient measures 3.0 mmHg. Aortic valve peak gradient measures 5.3 mmHg. Aortic valve area, by VTI measures 3.29 cm. Pulmonic Valve: The pulmonic valve was normal in structure. Pulmonic valve regurgitation is not visualized. Aorta: The aortic arch was not well visualized. IAS/Shunts: No atrial level shunt detected by color flow Doppler.  LEFT VENTRICLE PLAX 2D LVIDd:         4.56 cm  Diastology LVIDs:         2.47 cm  LV e' lateral:   9.46 cm/s LV PW:         1.18 cm  LV E/e' lateral: 9.7 LV IVS:        1.38 cm  LV  e' medial:    2.83 cm/s LVOT diam:     2.30 cm  LV E/e' medial:  32.3 LV SV:         66 LV SV Index:   34 LVOT Area:     4.15 cm  RIGHT VENTRICLE RV S prime:     15.30 cm/s TAPSE (M-mode): 4.2 cm LEFT ATRIUM             Index       RIGHT ATRIUM           Index LA diam:        4.20 cm 2.14 cm/m  RA Area:     17.00 cm LA Vol (A2C):   77.3 ml 39.30 ml/m RA Volume:   47.50 ml  24.15 ml/m LA Vol (A4C):   47.1 ml 23.95 ml/m LA Biplane Vol: 64.4 ml 32.74 ml/m  AORTIC VALVE                   PULMONIC VALVE AV Area (Vmax):    3.24 cm    PV Vmax:        0.59 m/s AV Area (Vmean):   3.49 cm    PV Peak grad:   1.4 mmHg AV Area (VTI):     3.29 cm    RVOT Peak grad: 2 mmHg AV Vmax:           115.50 cm/s AV Vmean:          75.650 cm/s AV VTI:            0.202 m AV Peak Grad:      5.3 mmHg AV Mean Grad:      3.0 mmHg LVOT Vmax:         90.10 cm/s LVOT Vmean:        63.600 cm/s LVOT VTI:          0.160 m LVOT/AV VTI ratio: 0.79  AORTA Ao Root diam: 3.30 cm MITRAL VALVE               TRICUSPID VALVE MV Area (PHT): 6.54 cm    TR Peak grad:   15.7 mmHg MV Decel Time: 116 msec  TR Vmax:        198.00 cm/s MV E velocity: 91.30 cm/s MV A velocity: 75.00 cm/s  SHUNTS MV E/A ratio:  1.22        Systemic VTI:  0.16 m                            Systemic Diam: 2.30 cm Dwayne Prince Rome MD Electronically signed by Yolonda Kida MD Signature Date/Time: 05/19/2020/4:17:15 PM    Final    DG HIP UNILAT WITH PELVIS 2-3 VIEWS LEFT  Result Date: 05/19/2020 CLINICAL DATA:  Chronic left groin pain which may be due to remote fall. Initial encounter. EXAM: DG HIP (WITH OR WITHOUT PELVIS) 2-3V LEFT COMPARISON:  CT abdomen and pelvis 09/12/2010. FINDINGS: There is no evidence of hip fracture or dislocation. There is no evidence of arthropathy or other focal bone abnormality. IMPRESSION: Normal exam. Electronically Signed   By: Inge Rise M.D.   On: 05/19/2020 09:25    Microbiology: Recent Results (from the past 240 hour(s))    SARS Coronavirus 2 by RT PCR (hospital order, performed in [facility] hospital lab) Nasopharyngeal Nasopharyngeal Swab     Status: None   Collection Time: 05/18/20  3:23 PM   Specimen: Nasopharyngeal Swab  Result Value Ref Range Status   SARS Coronavirus 2 NEGATIVE NEGATIVE Final    Comment: (NOTE) SARS-CoV-2 target nucleic acids are NOT DETECTED.  The SARS-CoV-2 RNA is generally detectable in upper and lower respiratory specimens during the acute phase of infection. The lowest concentration of SARS-CoV-2 viral copies this assay can detect is 250 copies / mL. A negative result does not preclude SARS-CoV-2 infection and should not be used as the sole basis for treatment or other patient management decisions.  A negative result may occur with improper specimen collection / handling, submission of specimen other than nasopharyngeal swab, presence of viral mutation(s) within the areas targeted by this assay, and inadequate number of viral copies (<250 copies / mL). A negative result must be combined with clinical observations, patient history, and epidemiological information.  Fact Sheet for Patients:   StrictlyIdeas.no  Fact Sheet for Healthcare Providers: BankingDealers.co.za  This test is not yet approved or  cleared by the [place] FDA and has been authorized for detection and/or diagnosis of SARS-CoV-2 by FDA under an Emergency Use Authorization (EUA).  This EUA will remain in effect (meaning this test can be used) for the duration of the COVID-19 declaration under Section 564(b)(1) of the Act, 21 U.S.C. section 360bbb-3(b)(1), unless the authorization is terminated or revoked sooner.  Performed at [facility], [address] [place], [place] [address]      Labs: Basic Metabolic Panel: Recent Labs  Lab 05/18/20 1330 05/19/20 0223 05/20/20 0314 05/21/20 0608 05/22/20 0333  NA 139 141 144 140 142  K 3.6  3.4* 3.5 3.7 4.0  CL 102 108 112* 109 111  CO2 28 25 26 24 28   GLUCOSE 115* 97 110* 151* 105*  BUN 14 11 7 9 13   CREATININE 1.15 1.10 1.04 0.97 1.07  CALCIUM 9.3 9.0 8.6* 8.6* 8.7*  MG  --  1.9  --   --   --    Liver Function Tests: Recent Labs  Lab 05/19/20 0223  AST 22  ALT 26  ALKPHOS 51  BILITOT 0.8  PROT 6.8  ALBUMIN 4.1   No results for input(s): LIPASE, AMYLASE in the last 168 hours.  No results for input(s): AMMONIA in the last 168 hours. CBC: Recent Labs  Lab 05/19/20 2124 05/19/20 2124 05/20/20 0314 05/20/20 0314 05/20/20 0904 05/21/20 0608 05/21/20 1349 05/22/20 0333 05/23/20 0500  WBC 8.5  --  7.0  --  7.0 8.5  --  8.9  --   HGB 9.6*   < > 8.3*   < > 8.3* 7.3* 6.8* 8.4* 8.7*  HCT 28.2*   < > 25.0*   < > 25.2* 22.0* 21.0* 25.9* 26.2*  MCV 81.5  --  82.2  --  82.1 82.4  --  84.4  --   PLT 235  --  221  --  214 211  --  192  --    < > = values in this interval not displayed.   Cardiac Enzymes: No results for input(s): CKTOTAL, CKMB, CKMBINDEX, TROPONINI in the last 168 hours. BNP: BNP (last 3 results) No results for input(s): BNP in the last 8760 hours.  ProBNP (last 3 results) No results for input(s): PROBNP in the last 8760 hours.  CBG: Recent Labs  Lab 05/20/20 0738 05/21/20 0804 05/21/20 1156 05/22/20 1003 05/23/20 0806  GLUCAP 101* 123* 103* 79 92       Signed:  Zareah Hunzeker  [facility] 05/23/2020, 9:08 AM

## 2020-05-23 NOTE — Discharge Instructions (Signed)
Anemia  Anemia is a condition in which you do not have enough red blood cells or hemoglobin. Hemoglobin is a substance in red blood cells that carries oxygen. When you do not have enough red blood cells or hemoglobin (are anemic), your body cannot get enough oxygen and your organs may not work properly. As a result, you may feel very tired or have other problems. What are the causes? Common causes of anemia include:  Excessive bleeding. Anemia can be caused by excessive bleeding inside or outside the body, including bleeding from the intestine or from periods in women.  Poor nutrition.  Long-lasting (chronic) kidney, thyroid, and liver disease.  Bone marrow disorders.  Cancer and treatments for cancer.  HIV (human immunodeficiency virus) and AIDS (acquired immunodeficiency syndrome).  Treatments for HIV and AIDS.  Spleen problems.  Blood disorders.  Infections, medicines, and autoimmune disorders that destroy red blood cells. What are the signs or symptoms? Symptoms of this condition include:  Minor weakness.  Dizziness.  Headache.  Feeling heartbeats that are irregular or faster than normal (palpitations).  Shortness of breath, especially with exercise.  Paleness.  Cold sensitivity.  Indigestion.  Nausea.  Difficulty sleeping.  Difficulty concentrating. Symptoms may occur suddenly or develop slowly. If your anemia is mild, you may not have symptoms. How is this diagnosed? This condition is diagnosed based on:  Blood tests.  Your medical history.  A physical exam.  Bone marrow biopsy. Your health care provider may also check your stool (feces) for blood and may do additional testing to look for the cause of your bleeding. You may also have other tests, including:  Imaging tests, such as a CT scan or MRI.  Endoscopy.  Colonoscopy. How is this treated? Treatment for this condition depends on the cause. If you continue to lose a lot of blood, you may  need to be treated at a hospital. Treatment may include:  Taking supplements of iron, vitamin S31, or folic acid.  Taking a hormone medicine (erythropoietin) that can help to stimulate red blood cell growth.  Having a blood transfusion. This may be needed if you lose a lot of blood.  Making changes to your diet.  Having surgery to remove your spleen. Follow these instructions at home:  Take over-the-counter and prescription medicines only as told by your health care provider.  Take supplements only as told by your health care provider.  Follow any diet instructions that you were given.  Keep all follow-up visits as told by your health care provider. This is important. Contact a health care provider if:  You develop new bleeding anywhere in the body. Get help right away if:  You are very weak.  You are short of breath.  You have pain in your abdomen or chest.  You are dizzy or feel faint.  You have trouble concentrating.  You have bloody or black, tarry stools.  You vomit repeatedly or you vomit up blood. Summary  Anemia is a condition in which you do not have enough red blood cells or enough of a substance in your red blood cells that carries oxygen (hemoglobin).  Symptoms may occur suddenly or develop slowly.  If your anemia is mild, you may not have symptoms.  This condition is diagnosed with blood tests as well as a medical history and physical exam. Other tests may be needed.  Treatment for this condition depends on the cause of the anemia. This information is not intended to replace advice given to you by  your health care provider. Make sure you discuss any questions you have with your health care provider. Document Revised: 10/26/2017 Document Reviewed: 12/15/2016 Elsevier Patient Education  [place].  Nonspecific Chest Pain Chest pain can be caused by many different conditions. Some causes of chest pain can be life-threatening. These will require  treatment right away. Serious causes of chest pain include:  Heart attack.  A tear in the body's main blood vessel.  Redness and swelling (inflammation) around your heart.  Blood clot in your lungs. Other causes of chest pain may not be so serious. These include:  Heartburn.  Anxiety or stress.  Damage to bones or muscles in your chest.  Lung infections. Chest pain can feel like:  Pain or discomfort in your chest.  Crushing, pressure, aching, or squeezing pain.  Burning or tingling.  Dull or sharp pain that is worse when you move, cough, or take a deep breath.  Pain or discomfort that is also felt in your back, neck, jaw, shoulder, or arm, or pain that spreads to any of these areas. It is hard to know whether your pain is caused by something that is serious or something that is not so serious. So it is important to see your doctor right away if you have chest pain. Follow these instructions at home: Medicines  Take over-the-counter and prescription medicines only as told by your doctor.  If you were prescribed an antibiotic medicine, take it as told by your doctor. Do not stop taking the antibiotic even if you start to feel better. Lifestyle   Rest as told by your doctor.  Do not use any products that contain nicotine or tobacco, such as cigarettes, e-cigarettes, and chewing tobacco. If you need help quitting, ask your doctor.  Do not drink alcohol.  Make lifestyle changes as told by your doctor. These may include: ? Getting regular exercise. Ask your doctor what activities are safe for you. ? Eating a heart-healthy diet. A diet and nutrition specialist (dietitian) can help you to learn healthy eating options. ? Staying at a healthy weight. ? Treating diabetes or high blood pressure, if needed. ? Lowering your stress. Activities such as yoga and relaxation techniques can help. General instructions  Pay attention to any changes in your symptoms. Tell your doctor  about them or any new symptoms.  Avoid any activities that cause chest pain.  Keep all follow-up visits as told by your doctor. This is important. You may need more testing if your chest pain does not go away. Contact a doctor if:  Your chest pain does not go away.  You feel depressed.  You have a fever. Get help right away if:  Your chest pain is worse.  You have a cough that gets worse, or you cough up blood.  You have very bad (severe) pain in your belly (abdomen).  You pass out (faint).  You have either of these for no clear reason: ? Sudden chest discomfort. ? Sudden discomfort in your arms, back, neck, or jaw.  You have shortness of breath at any time.  You suddenly start to sweat, or your skin gets clammy.  You feel sick to your stomach (nauseous).  You throw up (vomit).  You suddenly feel lightheaded or dizzy.  You feel very weak or tired.  Your heart starts to beat fast, or it feels like it is skipping beats. These symptoms may be an emergency. Do not wait to see if the symptoms will go away. Get  medical help right away. Call your local emergency services (911 in the [place] Do not drive yourself to the hospital. Summary  Chest pain can be caused by many different conditions. The cause may be serious and need treatment right away. If you have chest pain, see your doctor right away.  Follow your doctor's instructions for taking medicines and making lifestyle changes.  Keep all follow-up visits as told by your doctor. This includes visits for any further testing if your chest pain does not go away.  Be sure to know the signs that show that your condition has become worse. Get help right away if you have these symptoms. This information is not intended to replace advice given to you by your health care provider. Make sure you discuss any questions you have with your health care provider. Document Revised: 05/16/2018 Document Reviewed: 05/16/2018 Elsevier  Patient Education  2020 [employer].

## 2020-05-24 ENCOUNTER — Encounter: Payer: Self-pay | Admitting: Gastroenterology

## 2020-05-24 LAB — H PYLORI, IGM, IGG, IGA AB
H Pylori IgG: 0.29 Index Value (ref 0.00–0.79)
H. Pylogi, Iga Abs: <9 units (ref 0.0–8.9)
H. Pylogi, Igm Abs: <9 units (ref 0.0–8.9)

## 2020-05-25 LAB — SURGICAL PATHOLOGY

## 2021-12-02 IMAGING — CR DG CHEST 2V
1 series · 2 of 2 positions shown · non-contrast
Comparison: 09/27/2010

CLINICAL DATA: Chest pain and shortness of breath for 1 week

EXAM:
CHEST - 2 VIEW

[Series 1: dg chest 2 view · 0.14mm/px · 2 of 2 slices shown]
[im 1/2]
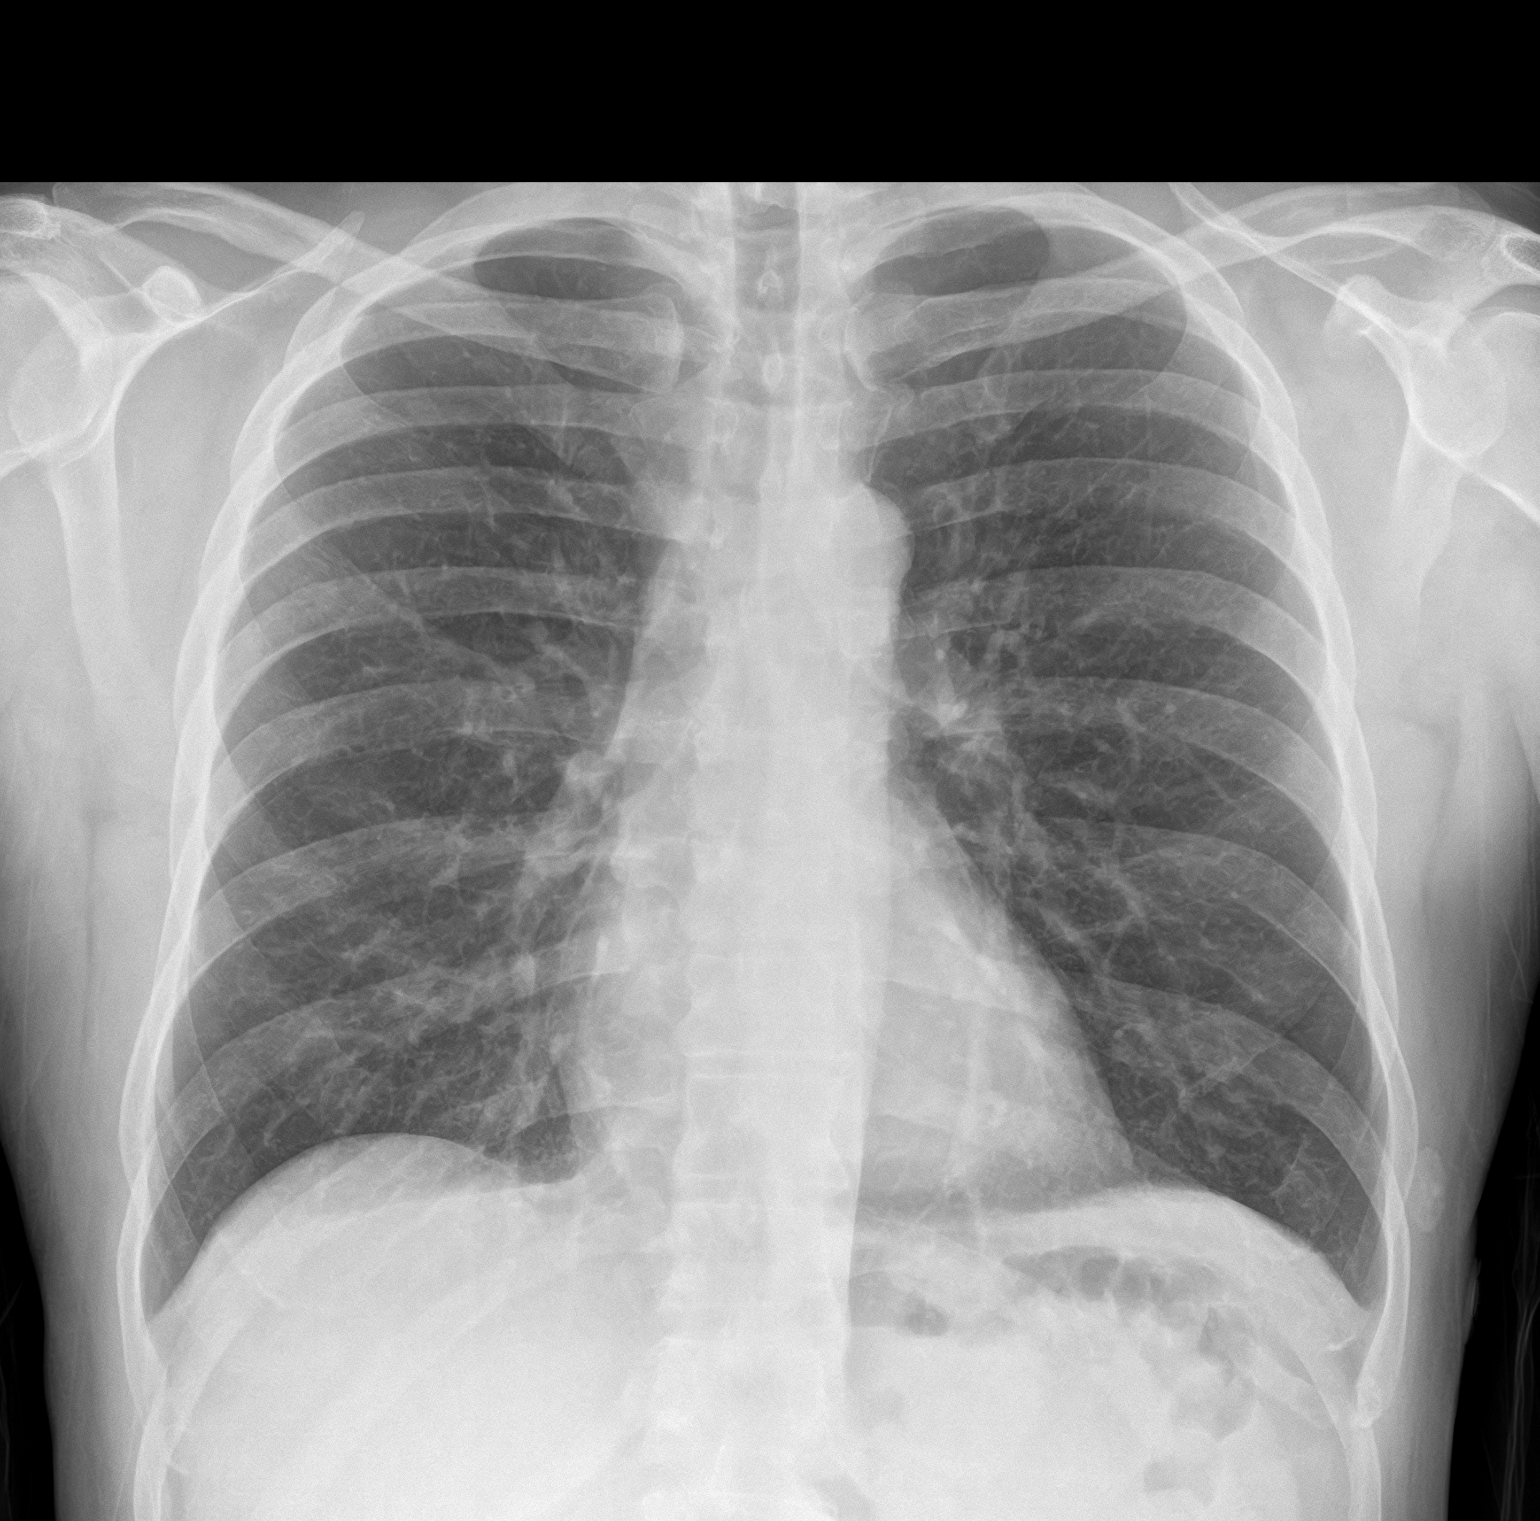
[im 2/2]
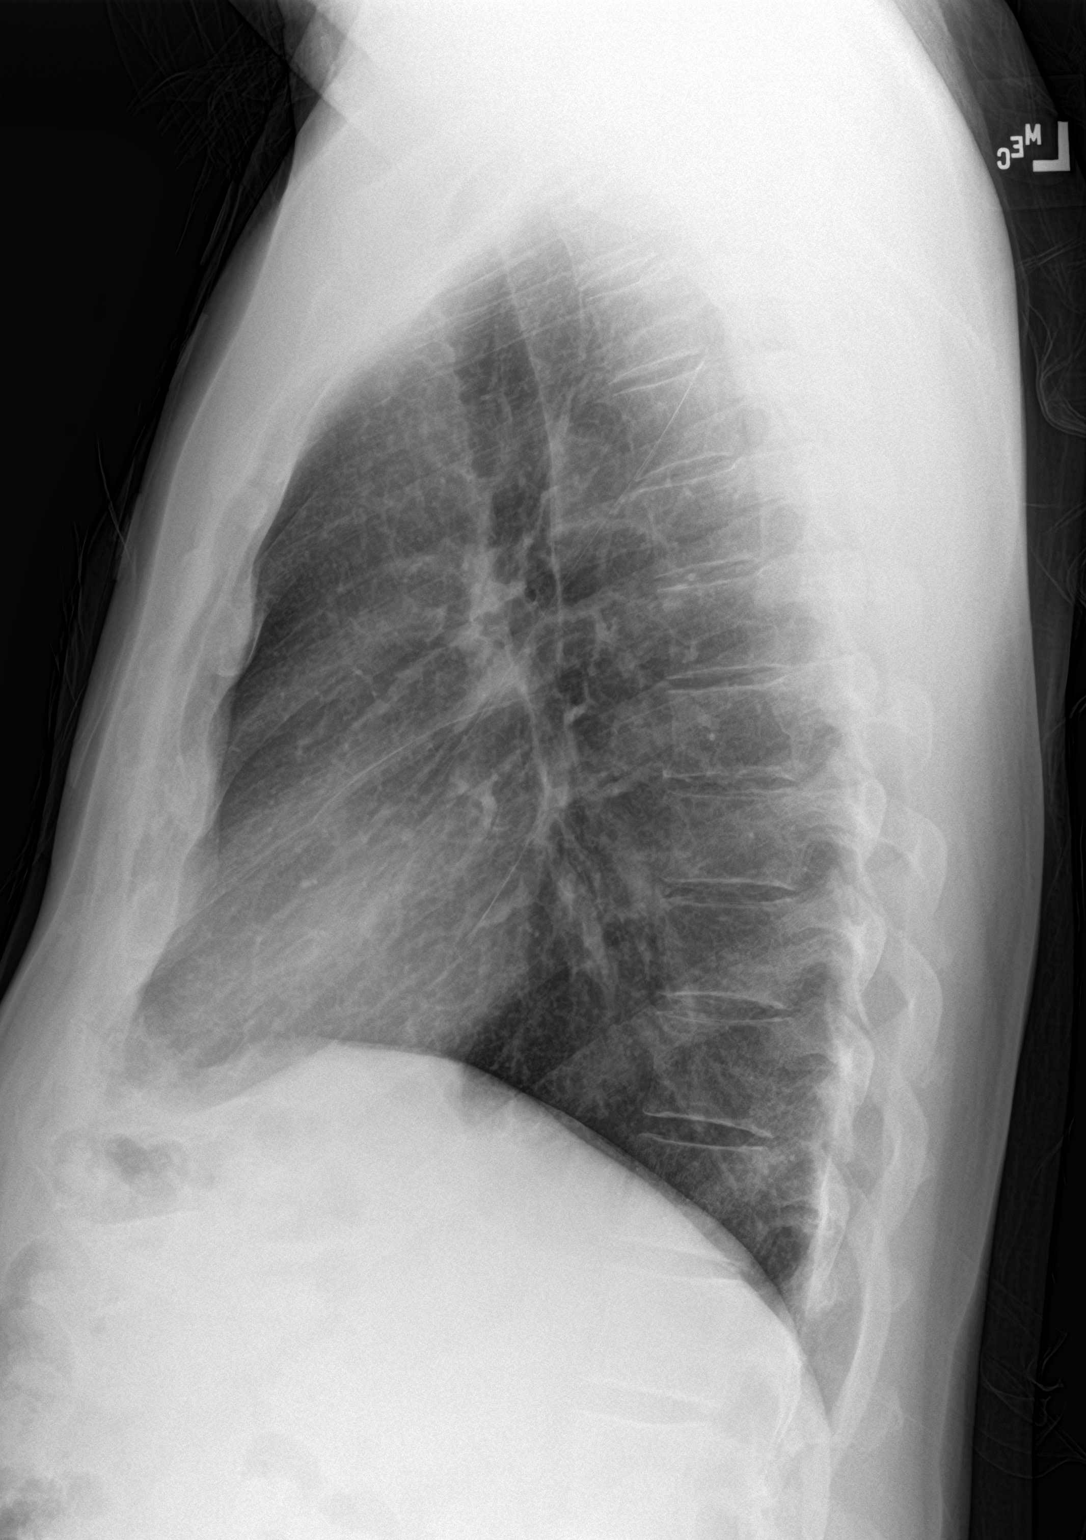

[2 of 2 positions shown; findings below may reference images not displayed]

FINDINGS: The heart size and mediastinal contours are within normal limits.
Both lungs are clear. The visualized skeletal structures are
unremarkable.
IMPRESSION: No active cardiopulmonary disease.

## 2021-12-05 ENCOUNTER — Ambulatory Visit: Payer: Self-pay | Admitting: Cardiology

## 2022-01-02 ENCOUNTER — Ambulatory Visit (INDEPENDENT_AMBULATORY_CARE_PROVIDER_SITE_OTHER): Payer: Self-pay | Admitting: Dermatology

## 2022-01-02 ENCOUNTER — Other Ambulatory Visit: Payer: Self-pay

## 2022-01-02 ENCOUNTER — Encounter: Payer: Self-pay | Admitting: Dermatology

## 2022-01-02 DIAGNOSIS — L720 Epidermal cyst: Secondary | ICD-10-CM

## 2022-01-02 NOTE — Progress Notes (Signed)
° °  New Patient Visit  Subjective  Bradley Quinn is a 46 y.o. male who presents for the following: New Patient (Initial Visit) (New patient here today concerning a possible cyst at left chest. He reports has been present for over a year and sometimes is painful. ). The patient has spots, moles and lesions to be evaluated, some may be new or changing and the patient has concerns that these could be cancer.  The following portions of the chart were reviewed this encounter and updated as appropriate:   Tobacco   Allergies   Meds   Problems   Med Hx   Surg Hx   Fam Hx      Review of Systems:  No other skin or systemic complaints except as noted in HPI or Assessment and Plan.  Objective  Well appearing patient in no apparent distress; mood and affect are within normal limits.  A focused examination was performed including left chest. Relevant physical exam findings are noted in the Assessment and Plan.  left chest 3 cm subcutaneous nodule.    Assessment & Plan  Epidermal inclusion cyst left chest  Not inflamed at exam today.  Cyst with symptoms and/or recent change.  Discussed surgical excision to remove, including resulting scar and possible recurrence.  Patient will schedule for surgery. Pre-op information given. Also recommend shaving area around left chest a few days prior to surgery.   Patient would like to scheduled to have removed.   Patient does take a daily aspirin regimen related to heart condition. Patient advised to continue aspirin   Return for tuesday cyst removal surgery . IRuthell Rummage, CMA, am acting as scribe for Sarina Ser, MD. Documentation: I have reviewed the above documentation for accuracy and completeness, and I agree with the above.  Sarina Ser, MD

## 2022-01-02 NOTE — Patient Instructions (Addendum)
Recommend shaving area at left chest a few days before surgery.   Continue Aspirin     Pre-Operative Instructions  You are scheduled for a surgical procedure at [facility]. We recommend you read the following instructions. If you have any questions or concerns, please call the office at [phone].  Shower and wash the entire body with soap and water the day of your surgery paying special attention to cleansing at and around the planned surgery site.  Avoid aspirin or aspirin containing products at least fourteen (14) days prior to your surgical procedure and for at least one week (7 Days) after your surgical procedure. If you take aspirin on a regular basis for heart disease or history of stroke or for any other reason, we may recommend you continue taking aspirin but please notify us if you take this on a regular basis. Aspirin can cause more bleeding to occur during surgery as well as prolonged bleeding and bruising after surgery.   Avoid other nonsteroidal pain medications at least one week prior to surgery and at least one week prior to your surgery. These include medications such as Ibuprofen (Motrin, Advil and Nuprin), Naprosyn, Voltaren, Relafen, etc. If medications are used for therapeutic reasons, please inform us as they can cause increased bleeding or prolonged bleeding during and bruising after surgical procedures.   Please advise [place] if you are taking any "blood thinner" medications such as Coumadin or Dipyridamole or Plavix or similar medications. These cause increased bleeding and prolonged bleeding during procedures and bruising after surgical procedures. We may have to consider discontinuing these medications briefly prior to and shortly after your surgery if safe to do so.   Please inform us of all medications you are currently taking. All medications that are taken regularly should be taken the day of surgery as you always do. Nevertheless, we need to be informed  of what medications you are taking prior to surgery to know whether they will affect the procedure or cause any complications.   Please inform us of any medication allergies. Also inform us of whether you have allergies to Latex or rubber products or whether you have had any adverse reaction to Lidocaine or Epinephrine.  Please inform us of any prosthetic or artificial body parts such as artificial heart valve, joint replacements, etc., or similar condition that might require preoperative antibiotics.   We recommend avoidance of alcohol at least two weeks prior to surgery and continued avoidance for at least two weeks after surgery.   We recommend discontinuation of tobacco smoking at least two weeks prior to surgery and continued abstinence for at least two weeks after surgery.  Do not plan strenuous exercise, strenuous work or strenuous lifting for approximately four weeks after your surgery.   We request if you are unable to make your scheduled surgical appointment, please call us at least a week in advance or as soon as you are aware of a problem so that we can cancel or reschedule the appointment.   You MAY TAKE TYLENOL (acetaminophen) for pain as it is not a blood thinner.   PLEASE PLAN TO BE IN [place] FOR TWO WEEKS FOLLOWING SURGERY, THIS IS IMPORTANT SO YOU CAN BE CHECKED FOR DRESSING CHANGES, SUTURE REMOVAL AND TO MONITOR FOR POSSIBLE COMPLICATIONS.        If You Need Anything After Your Visit  If you have any questions or concerns for your doctor, please call our main line at [phone] and press option 4 to  reach your doctor's medical assistant. If no one answers, please leave a voicemail as directed and we will return your call as soon as possible. Messages left after 4 pm will be answered the following business day.   You may also send [place] a message via [place]. We typically respond to MyChart messages within 1-2 business days.  For prescription refills, please ask your  pharmacy to contact our office. Our fax number is [phone].  If you have an urgent issue when the clinic is closed that cannot wait until the next business day, you can page your doctor at the number below.    Please note that while we do our best to be available for urgent issues outside of office hours, we are not available 24/7.   If you have an urgent issue and are unable to reach [place], you may choose to seek medical care at your doctor's office, retail clinic, urgent care center, or emergency room.  If you have a medical emergency, please immediately call 911 or go to the emergency department.  Pager Numbers  - Dr. Nehemiah Massed: [phone]  - Dr. Laurence Ferrari: [phone]  - Dr. Nicole Kindred: [phone]  In the event of inclement weather, please call our main line at [phone] for an update on the status of any delays or closures.  Dermatology Medication Tips: Please keep the boxes that topical medications come in in order to help keep track of the instructions about where and how to use these. Pharmacies typically print the medication instructions only on the boxes and not directly on the medication tubes.   If your medication is too expensive, please contact our office at [phone] option 4 or send [place] a message through [place].   We are unable to tell what your co-pay for medications will be in advance as this is different depending on your insurance coverage. However, we may be able to find a substitute medication at lower cost or fill out paperwork to get insurance to cover a needed medication.   If a prior authorization is required to get your medication covered by your insurance company, please allow [place] 1-2 business days to complete this process.  Drug prices often vary depending on where the prescription is filled and some pharmacies may offer cheaper prices.  The website www.goodrx.com contains coupons for medications through different pharmacies. The prices here do not  account for what the cost may be with help from insurance (it may be cheaper with your insurance), but the website can give you the price if you did not use any insurance.  - You can print the associated coupon and take it with your prescription to the pharmacy.  - You may also stop by our office during regular business hours and pick up a GoodRx coupon card.  - If you need your prescription sent electronically to a different pharmacy, notify our office through [facility] or by phone at [phone] option 4.     Si Usted Necesita Algo Despus de Su Visita  Tambin puede enviarnos un mensaje a travs de Pharmacist, community. Por lo general respondemos a los mensajes de MyChart en el transcurso de 1 a 2 das hbiles.  Para renovar recetas, por favor pida a su farmacia que se ponga en contacto con nuestra oficina. Harland Dingwall de fax es [place] [phone].  Si tiene un asunto urgente cuando la clnica est cerrada y que no puede esperar hasta el siguiente da hbil, puede llamar/localizar a su doctor(a) al nmero que aparece a continuacin.  Por favor, tenga en cuenta que aunque hacemos todo lo posible para estar disponibles para asuntos urgentes fuera del horario de [place], no estamos disponibles las 24 horas del da, los 7 das de la [place].   Si tiene un problema urgente y no puede comunicarse con nosotros, puede optar por buscar atencin mdica  en el consultorio de su doctor(a), en una clnica privada, en un centro de atencin urgente o en una sala de emergencias.  Si tiene Engineering geologist, por favor llame inmediatamente al 911 o vaya a la sala de emergencias.  Nmeros de bper  - Dr. Nehemiah Massed: [phone]  - Dra. Moye: [phone]  - Dra. Nicole Kindred: [phone]  En caso de inclemencias del [place], por favor llame a Johnsie Kindred principal al [phone] para una actualizacin sobre el [place] de cualquier retraso o cierre.  Consejos para la medicacin en dermatologa: Por  favor, guarde las cajas en las que vienen los medicamentos de uso tpico para ayudarle a seguir las instrucciones sobre dnde y cmo usarlos. Las farmacias generalmente imprimen las instrucciones del medicamento slo en las cajas y no directamente en los tubos del [place].   Si su medicamento es muy caro, por favor, pngase en contacto con Zigmund Daniel llamando al [phone] y presione la opcin 4 o envenos un mensaje a travs de Pharmacist, community.   No podemos decirle cul ser su copago por los medicamentos por adelantado ya que esto es diferente dependiendo de la cobertura de su seguro. Sin embargo, es posible que podamos encontrar un medicamento sustituto a Electrical engineer un formulario para que el seguro cubra el medicamento que se considera necesario.   Si se requiere una autorizacin previa para que su compaa de seguros [place] su medicamento, por favor permtanos de 1 a 2 das hbiles para completar este proceso.  Los precios de los medicamentos varan con frecuencia dependiendo del Environmental consultant de dnde se surte la receta y alguna farmacias pueden ofrecer precios ms baratos.  El sitio web www.goodrx.com tiene cupones para medicamentos de Airline pilot. Los precios aqu no tienen en cuenta lo que podra costar con la ayuda del seguro (puede ser ms barato con su seguro), pero el sitio web puede darle el precio si no utiliz Research scientist (physical sciences).  - Puede imprimir el cupn correspondiente y llevarlo con su receta a la farmacia.  - Tambin puede pasar por nuestra oficina durante el horario de atencin regular y Charity fundraiser una tarjeta de cupones de GoodRx.  - Si necesita que su receta se enve electrnicamente a una farmacia diferente, informe a nuestra oficina a travs de MyChart de [place] o por telfono llamando al [phone] y presione la opcin 4.

## 2022-01-06 ENCOUNTER — Ambulatory Visit: Payer: Medicaid Other | Admitting: Cardiology

## 2022-01-16 ENCOUNTER — Ambulatory Visit: Payer: Medicaid Other | Admitting: Cardiology

## 2022-01-24 ENCOUNTER — Telehealth: Payer: Self-pay

## 2022-01-24 NOTE — Telephone Encounter (Signed)
Patient called this morning inquiring about the cost of his cyst removal surgery. Can you please advise what the likely coding will be for his upcoming surgery visit?

## 2022-02-14 ENCOUNTER — Encounter: Payer: Medicaid Other | Admitting: Dermatology

## 2022-03-28 ENCOUNTER — Ambulatory Visit (INDEPENDENT_AMBULATORY_CARE_PROVIDER_SITE_OTHER): Payer: Self-pay | Admitting: Dermatology

## 2022-03-28 ENCOUNTER — Telehealth: Payer: Self-pay

## 2022-03-28 DIAGNOSIS — L720 Epidermal cyst: Secondary | ICD-10-CM

## 2022-03-28 DIAGNOSIS — D492 Neoplasm of unspecified behavior of bone, soft tissue, and skin: Secondary | ICD-10-CM

## 2022-03-28 MED ORDER — MUPIROCIN 2 % EX OINT: 1.0000 "application " | TOPICAL_OINTMENT | Freq: Every day | CUTANEOUS | 1 refills | Status: DC

## 2022-03-28 NOTE — Telephone Encounter (Signed)
Left pt message to call if any problems after surgery this morning./sh ?

## 2022-03-28 NOTE — Progress Notes (Signed)
? ?  Follow-Up Visit ?  ?Subjective  ?Bradley Quinn is a 46 y.o. male who presents for the following: Cyst vs other (L chest, pt presents for excision). ? ?Patient accompanied by his wife. ? ?The following portions of the chart were reviewed this encounter and updated as appropriate:  ? Tobacco  Allergies  Meds  Problems  Med Hx  Surg Hx  Fam Hx   ?  ? ?Review of Systems:  No other skin or systemic complaints except as noted in HPI or Assessment and Plan. ? ?Objective  ?Well appearing patient in no apparent distress; mood and affect are within normal limits. ? ?A focused examination was performed including chest. Relevant physical exam findings are noted in the Assessment and Plan. ? ?L chest ?Cystic pap 3.5cm ? ? ?Assessment & Plan  ?Neoplasm of skin ?L chest ? ?Skin excision ? ?Lesion length (cm):  3.5 ?Lesion width (cm):  3.5 ?Margin per side (cm):  0 ?Total excision diameter (cm):  3.5 ?Informed consent: discussed and consent obtained   ?Timeout: patient name, date of birth, surgical site, and procedure verified   ?Procedure prep:  Patient was prepped and draped in usual sterile fashion ?Prep type:  Isopropyl alcohol and povidone-iodine ?Anesthesia: the lesion was anesthetized in a standard fashion   ?Anesthetic:  1% lidocaine w/ epinephrine 1-100,000 buffered w/ 8.4% NaHCO3 (9cc lido w/ epi, 6cc bupivicaine, Total of 15cc) ?Instrument used: #15 blade   ?Hemostasis achieved with: pressure   ?Hemostasis achieved with comment:  Electrocautery ?Outcome: patient tolerated procedure well with no complications   ?Post-procedure details: sterile dressing applied and wound care instructions given   ?Dressing type: bandage, pressure dressing and bacitracin (Mupirocin)   ? ?Skin repair ?Complexity:  Complex ?Final length (cm):  4 ?Reason for type of repair: reduce tension to allow closure, reduce the risk of dehiscence, infection, and necrosis, reduce subcutaneous dead space and avoid a hematoma, allow closure of  the large defect, preserve normal anatomy, preserve normal anatomical and functional relationships and enhance both functionality and cosmetic results   ?Undermining: area extensively undermined   ?Undermining comment:  Undermining Defect 3.5cm ?Subcutaneous layers (deep stitches):  ?Suture size:  3-0 ?Suture type: Vicryl (polyglactin 910)   ?Subcutaneous suture technique: Inverted Dermal. ?Fine/surface layer approximation (top stitches):  ?Suture size:  3-0 ?Suture type: nylon   ?Stitches: simple running   ?Suture removal (days):  7 ?Hemostasis achieved with: pressure ?Outcome: patient tolerated procedure well with no complications   ?Post-procedure details: sterile dressing applied and wound care instructions given   ?Dressing type: bandage, pressure dressing and bacitracin (Mupirocin)   ? ?mupirocin ointment (BACTROBAN) 2 % ?Apply 1 application. topically daily. Qd to excision site ? ?Specimen 1 - Surgical pathology ?Differential Diagnosis: D48.5 Cyst vs other ? ?Check Margins: No ?Cystic pap 3.5cm ? ?Cyst vs other, excised today ?Start Mupirocin oint qd to excision site ? ? ?Return in about 1 week (around 04/04/2022) for suture removal. ? ?I, Othelia Pulling, RMA, am acting as scribe for Sarina Ser, MD . ?Documentation: I have reviewed the above documentation for accuracy and completeness, and I agree with the above. ? ?Sarina Ser, MD ? ?

## 2022-03-28 NOTE — Patient Instructions (Signed)
Wound Care Instructions ? ?On the day following your surgery, you should begin doing daily dressing changes: ?Remove the old dressing and discard it. ?Cleanse the wound gently with tap water. This may be done in the shower or by placing a wet gauze pad directly on the wound and letting it soak for several minutes. ?It is important to gently remove any dried blood from the wound in order to encourage healing. This may be done by gently rolling a moistened Q-tip on the dried blood. Do not pick at the wound. ?If the wound should start to bleed, continue cleaning the wound, then place a moist gauze pad on the wound and hold pressure for a few minutes.  ?Make sure you then dry the skin surrounding the wound completely or the tape will not stick to the skin. Do not use cotton balls on the wound. ?After the wound is clean and dry, apply the ointment gently with a Q-tip. ?Cut a non-stick pad to fit the size of the wound. Lay the pad flush to the wound. If the wound is draining, you may want to reinforce it with a small amount of gauze on top of the non-stick pad for a little added compression to the area. ?Use the tape to seal the area completely. ?Select from the following with respect to your individual situation: ?If your wound has been stitched closed: continue the above steps 1-8 at least daily until your sutures are removed. ?If your wound has been left open to heal: continue steps 1-8 at least daily for the first 3-4 weeks. ?We would like for you to take a few extra precautions for at least the next week. ?Sleep with your head elevated on pillows if our wound is on your head. ?Do not bend over or lift heavy items to reduce the chance of elevated blood pressure to the wound ?Do not participate in particularly strenuous activities. ? ? ?Below is a list of dressing supplies you might need.  ?Cotton-tipped applicators - Q-tips ?Gauze pads (2x2 and/or 4x4) - All-Purpose Sponges ?Non-stick dressing material - Telfa ?Tape -  Paper or Hypafix ?New and clean tube of petroleum jelly - Vaseline  ? ? ?Comments on Post-Operative Period ?Slight swelling and redness often appear around the wound. This is normal and will disappear within several days following the surgery. ?The healing wound will drain a brownish-red-yellow discharge during healing. This is a normal phase of wound healing. As the wound begins to heal, the drainage may increase in amount. Again, this drainage is normal. ?Notify us if the drainage becomes persistently bloody, excessively swollen, or intensely painful or develops a foul odor or red streaks.  ?If you should experience mild discomfort during the healing phase, you may take an aspirin-free medication such as Tylenol (acetaminophen). Notify us if the discomfort is severe or persistent. Avoid alcoholic beverages when taking pain medicine. ? ?In Case of Wound Hemorrhage ?A wound hemorrhage is when the bandage suddenly becomes soaked with bright red blood and flows profusely. If this happens, sit down or lie down with your head elevated. If the wound has a dressing on it, do not remove the dressing. Apply pressure to the existing gauze. If the wound is not covered, use a gauze pad to apply pressure and continue applying the pressure for 20 minutes without peeking. DO NOT COVER THE WOUND WITH A LARGE TOWEL OR WASH CLOTH. Release your hand from the wound site but do not remove the dressing. If the bleeding has stopped,   gently clean around the wound. Leave the dressing in place for 24 hours if possible. This wait time allows the blood vessels to close off so that you do not spark a new round of bleeding by disrupting the newly clotted blood vessels with an immediate dressing change. If the bleeding does not subside, continue to hold pressure. If matters are out of your control, contact an After Hours clinic or go to the Emergency Room. ? ?If You Need Anything After Your Visit ? ?If you have any questions or concerns for your  doctor, please call our main line at [phone] and press option 4 to reach your doctor's medical assistant. If no one answers, please leave a voicemail as directed and we will return your call as soon as possible. Messages left after 4 pm will be answered the following business day.  ? ?You may also send us a message via MyChart. We typically respond to MyChart messages within 1-2 business days. ? ?For prescription refills, please ask your pharmacy to contact our office. Our fax number is [phone]. ? ?If you have an urgent issue when the clinic is closed that cannot wait until the next business day, you can page your doctor at the number below.   ? ?Please note that while we do our best to be available for urgent issues outside of office hours, we are not available 24/7.  ? ?If you have an urgent issue and are unable to reach us, you may choose to seek medical care at your doctor's office, retail clinic, urgent care center, or emergency room. ? ?If you have a medical emergency, please immediately call 911 or go to the emergency department. ? ?Pager Numbers ? ?- Dr. Kowalski: [phone] ? ?- Dr. Moye: [phone] ? ?- Dr. Stewart: [phone] ? ?In the event of inclement weather, please call our main line at [phone] for an update on the status of any delays or closures. ? ?Dermatology Medication Tips: ?Please keep the boxes that topical medications come in in order to help keep track of the instructions about where and how to use these. Pharmacies typically print the medication instructions only on the boxes and not directly on the medication tubes.  ? ?If your medication is too expensive, please contact our office at [phone] option 4 or send us a message through MyChart.  ? ?We are unable to tell what your co-pay for medications will be in advance as this is different depending on your insurance coverage. However, we may be able to find a substitute medication at lower cost or fill out paperwork  to get insurance to cover a needed medication.  ? ?If a prior authorization is required to get your medication covered by your insurance company, please allow us 1-2 business days to complete this process. ? ?Drug prices often vary depending on where the prescription is filled and some pharmacies may offer cheaper prices. ? ?The website www.goodrx.com contains coupons for medications through different pharmacies. The prices here do not account for what the cost may be with help from insurance (it may be cheaper with your insurance), but the website can give you the price if you did not use any insurance.  ?- You can print the associated coupon and take it with your prescription to the pharmacy.  ?- You may also stop by our office during regular business hours and pick up a GoodRx coupon card.  ?- If you need your prescription sent electronically to a different pharmacy, notify our office through   [facility] MyChart or by phone at [phone] option 4. ? ? ? ? ?Si Usted Necesita Algo Despu?s de Su Visita ? ?Tambi?n puede enviarnos un mensaje a trav?s de MyChart. Por lo general respondemos a los mensajes de MyChart en el transcurso de 1 a 2 d?as h?biles. ? ?Para renovar recetas, por favor pida a su farmacia que se ponga en contacto con nuestra oficina. Nuestro n?mero de fax es el [phone]. ? ?Si tiene un asunto urgente cuando la cl?nica est? cerrada y que no puede esperar hasta el siguiente d?a h?bil, puede llamar/localizar a su doctor(a) al n?mero que aparece a continuaci?n.  ? ?Por favor, tenga en cuenta que aunque hacemos todo lo posible para estar disponibles para asuntos urgentes fuera del horario de oficina, no estamos disponibles las 24 horas del d?a, los 7 d?as de la semana.  ? ?Si tiene un problema urgente y no puede comunicarse con nosotros, puede optar por buscar atenci?n m?dica  en el consultorio de su doctor(a), en una cl?nica privada, en un centro de atenci?n urgente o en una sala de  emergencias. ? ?Si tiene una emergencia m?dica, por favor llame inmediatamente al 911 o vaya a la sala de emergencias. ? ?N?meros de b?per ? ?- Dr. Kowalski: [phone] ? ?- Dra. Moye: [phone] ? ?- Dra. St

## 2022-03-29 ENCOUNTER — Ambulatory Visit (INDEPENDENT_AMBULATORY_CARE_PROVIDER_SITE_OTHER): Payer: Self-pay | Admitting: Dermatology

## 2022-03-29 DIAGNOSIS — D485 Neoplasm of uncertain behavior of skin: Secondary | ICD-10-CM

## 2022-03-29 NOTE — Patient Instructions (Signed)

## 2022-03-29 NOTE — Progress Notes (Signed)
? ?  Follow-Up Visit ?  ?Subjective  ?Bradley Quinn is a 46 y.o. male who presents for the following: Follow-up (Check excision site - had some bleeding over night). ?He admits to doing a lot of activity, more than he probably should have. ?Accompanied by wife ? ?The following portions of the chart were reviewed this encounter and updated as appropriate:  ? Tobacco  Allergies  Meds  Problems  Med Hx  Surg Hx  Fam Hx   ?  ?Review of Systems:  No other skin or systemic complaints except as noted in HPI or Assessment and Plan. ? ?Objective  ?Well appearing patient in no apparent distress; mood and affect are within normal limits. ? ?A focused examination was performed including chest. Relevant physical exam findings are noted in the Assessment and Plan. ? ?Left chest ?Healing excision site - no active bleeding today ? ? ?Assessment & Plan  ?Neoplasm of uncertain behavior of skin ?Postop day 1 cyst excision with some bleeding overnight.  Patient admits to doing vigorous activity yesterday leading to the bleeding.  He will be more conscientious about not stressing the surgery site.  The site is no longer bleeding today.  Cleansed and dressed ?Left chest ? ?Excision site and surrounding area cleansed with Puracyn. Mupirocin and pressure dressing applied. Advised patient to take it easy for the next couple of days and not do any strenuous activity or lifting. Advised him to keep pressure dressing on until Friday then get it wet in the shower to remove it. Wash excision site gently with soap and water then apply Puracyn and a bandage. ? ?Return for Follow up as scheduled. ? ?I, Ashok Cordia, CMA, am acting as scribe for Sarina Ser, MD . ?Documentation: I have reviewed the above documentation for accuracy and completeness, and I agree with the above. ? ?Sarina Ser, MD ? ?

## 2022-04-04 ENCOUNTER — Ambulatory Visit (INDEPENDENT_AMBULATORY_CARE_PROVIDER_SITE_OTHER): Payer: Self-pay | Admitting: Dermatology

## 2022-04-04 DIAGNOSIS — L820 Inflamed seborrheic keratosis: Secondary | ICD-10-CM

## 2022-04-04 DIAGNOSIS — L821 Other seborrheic keratosis: Secondary | ICD-10-CM

## 2022-04-04 DIAGNOSIS — L720 Epidermal cyst: Secondary | ICD-10-CM

## 2022-04-04 NOTE — Progress Notes (Signed)
   Follow-Up Visit   Subjective  Bradley Quinn is a 46 y.o. male who presents for the following: Suture removal (Pathology proven benign cyst of the L chest - pt is here today for suture removal) and Irritated skin lesion (L scalp - irritated, patient would like lesion checked today). The patient has spots, moles and lesions to be evaluated, some may be new or changing and the patient has concerns that these could be cancer.  The following portions of the chart were reviewed this encounter and updated as appropriate:   Tobacco  Allergies  Meds  Problems  Med Hx  Surg Hx  Fam Hx     Review of Systems:  No other skin or systemic complaints except as noted in HPI or Assessment and Plan.  Objective  Well appearing patient in no apparent distress; mood and affect are within normal limits.  A focused examination was performed including the trunk. Relevant physical exam findings are noted in the Assessment and Plan.  L chest Healing excision site.  L scalp x 1 Erythematous stuck-on, waxy papule or plaque   Assessment & Plan  Epidermal inclusion cyst -status post excision L chest Encounter for Removal of Sutures - Incision site at the L chest is clean, dry and intact - Wound cleansed, sutures removed, wound cleansed and steri strips applied.  - Discussed pathology results showing a benign cyst.  - Patient advised to keep steri-strips dry until they fall off. - Scars remodel for a full year. - Once steri-strips fall off, patient can apply over-the-counter silicone scar cream each night to help with scar remodeling if desired. - Patient advised to call with any concerns or if they notice any new or changing lesions.  Inflamed seborrheic keratosis L scalp x 1 Irritated.  Patient would like treatment. Destruction of lesion - L scalp x 1 Complexity: simple   Destruction method: cryotherapy   Informed consent: discussed and consent obtained   Timeout:  patient name, date of  birth, surgical site, and procedure verified Lesion destroyed using liquid nitrogen: Yes   Region frozen until ice ball extended beyond lesion: Yes   Outcome: patient tolerated procedure well with no complications   Post-procedure details: wound care instructions given    Seborrheic Keratoses - Stuck-on, waxy, tan-brown papules and/or plaques  - Benign-appearing - Discussed benign etiology and prognosis. - Observe - Call for any changes  Return if symptoms worsen or fail to improve.  Luther Redo, CMA, am acting as scribe for Sarina Ser, MD . Documentation: I have reviewed the above documentation for accuracy and completeness, and I agree with the above.  Sarina Ser, MD

## 2022-04-04 NOTE — Patient Instructions (Signed)

## 2022-04-05 ENCOUNTER — Encounter: Payer: Self-pay | Admitting: Dermatology

## 2022-04-11 ENCOUNTER — Encounter: Payer: Self-pay | Admitting: Dermatology

## 2022-04-18 ENCOUNTER — Encounter: Payer: Self-pay | Admitting: Dermatology

## 2024-11-21 ENCOUNTER — Other Ambulatory Visit: Payer: Self-pay

## 2024-11-21 ENCOUNTER — Emergency Department

## 2024-11-21 ENCOUNTER — Emergency Department
Admission: EM | Admit: 2024-11-21 | Discharge: 2024-11-21 | Disposition: A | Discharge status: Home / Self Care | Attending: Emergency Medicine | Admitting: Emergency Medicine

## 2024-11-21 DIAGNOSIS — Z7982 Long term (current) use of aspirin: Secondary | ICD-10-CM | POA: Diagnosis not present

## 2024-11-21 DIAGNOSIS — I10 Essential (primary) hypertension: Secondary | ICD-10-CM | POA: Diagnosis not present

## 2024-11-21 DIAGNOSIS — Z955 Presence of coronary angioplasty implant and graft: Secondary | ICD-10-CM | POA: Insufficient documentation

## 2024-11-21 DIAGNOSIS — K219 Gastro-esophageal reflux disease without esophagitis: Secondary | ICD-10-CM | POA: Diagnosis not present

## 2024-11-21 DIAGNOSIS — R109 Unspecified abdominal pain: Secondary | ICD-10-CM | POA: Diagnosis not present

## 2024-11-21 DIAGNOSIS — N50812 Left testicular pain: Secondary | ICD-10-CM | POA: Diagnosis present

## 2024-11-21 DIAGNOSIS — Z79899 Other long term (current) drug therapy: Secondary | ICD-10-CM | POA: Diagnosis not present

## 2024-11-21 DIAGNOSIS — N451 Epididymitis: Secondary | ICD-10-CM | POA: Diagnosis not present

## 2024-11-21 LAB — CBC
HCT: 46.8 % (ref 39.0–52.0)
Hemoglobin: 15.4 g/dL (ref 13.0–17.0)
MCH: 26.6 pg (ref 26.0–34.0)
MCHC: 32.9 g/dL (ref 30.0–36.0)
MCV: 80.7 fL (ref 80.0–100.0)
Platelets: 216 K/uL (ref 150–400)
RBC: 5.8 MIL/uL (ref 4.22–5.81)
RDW: 13.9 % (ref 11.5–15.5)
WBC: 7 K/uL (ref 4.0–10.5)
nRBC: 0 % (ref 0.0–0.2)

## 2024-11-21 LAB — URINALYSIS, ROUTINE W REFLEX MICROSCOPIC
Bacteria, UA: NONE SEEN
Bilirubin Urine: NEGATIVE
Glucose, UA: NEGATIVE mg/dL
Hgb urine dipstick: NEGATIVE
Ketones, ur: NEGATIVE mg/dL
Leukocytes,Ua: NEGATIVE
Nitrite: NEGATIVE
Protein, ur: NEGATIVE mg/dL
RBC / HPF: 0 RBC/hpf (ref 0–5)
Specific Gravity, Urine: 1.005 (ref 1.005–1.030)
pH: 7 (ref 5.0–8.0)

## 2024-11-21 LAB — BASIC METABOLIC PANEL WITH GFR
Anion gap: 12 (ref 5–15)
BUN: 8 mg/dL (ref 6–20)
CO2: 29 mmol/L (ref 22–32)
Calcium: 9.4 mg/dL (ref 8.9–10.3)
Chloride: 99 mmol/L (ref 98–111)
Creatinine, Ser: 1.13 mg/dL (ref 0.61–1.24)
GFR, Estimated: >60 mL/min
Glucose, Bld: 116 mg/dL — ABNORMAL HIGH (ref 70–99)
Potassium: 3.6 mmol/L (ref 3.5–5.1)
Sodium: 140 mmol/L (ref 135–145)

## 2024-11-21 LAB — CHLAMYDIA/NGC RT PCR (ARMC ONLY)
Chlamydia Tr: NOT DETECTED
N gonorrhoeae: NOT DETECTED

## 2024-11-21 MED ORDER — ONDANSETRON 4 MG PO TBDP
4.0000 mg | ORAL_TABLET | Freq: Once | ORAL | Status: AC
  Administered 2024-11-21: 4 mg via ORAL
  Filled 2024-11-21: qty 1

## 2024-11-21 MED ORDER — AMLODIPINE BESYLATE 5 MG PO TABS
5.0000 mg | ORAL_TABLET | Freq: Once | ORAL | Status: AC
  Administered 2024-11-21: 5 mg via ORAL
  Filled 2024-11-21: qty 1

## 2024-11-21 MED ORDER — OXYCODONE HCL 5 MG PO TABS: 5.0000 mg | ORAL_TABLET | Freq: Three times a day (TID) | ORAL | 0 refills | Status: DC | PRN

## 2024-11-21 MED ORDER — OXYCODONE-ACETAMINOPHEN 5-325 MG PO TABS
2.0000 | ORAL_TABLET | Freq: Once | ORAL | Status: AC
  Administered 2024-11-21: 2 via ORAL
  Filled 2024-11-21: qty 2

## 2024-11-21 MED ORDER — IBUPROFEN 800 MG PO TABS: 800.0000 mg | ORAL_TABLET | Freq: Three times a day (TID) | ORAL | 0 refills | Status: AC | PRN

## 2024-11-21 MED ORDER — LEVOFLOXACIN 500 MG PO TABS
750.0000 mg | ORAL_TABLET | Freq: Once | ORAL | Status: AC
  Administered 2024-11-21: 750 mg via ORAL
  Filled 2024-11-21: qty 2

## 2024-11-21 MED ORDER — AMLODIPINE BESYLATE 5 MG PO TABS: 5.0000 mg | ORAL_TABLET | Freq: Every day | ORAL | 1 refills | Status: AC

## 2024-11-21 MED ORDER — LEVOFLOXACIN 500 MG PO TABS: 500.0000 mg | ORAL_TABLET | Freq: Every day | ORAL | 0 refills | Status: AC

## 2024-11-21 MED ORDER — ONDANSETRON 4 MG PO TBDP: 4.0000 mg | ORAL_TABLET | Freq: Four times a day (QID) | ORAL | 0 refills | Status: AC | PRN

## 2024-11-21 NOTE — ED Triage Notes (Signed)
 Pt presents for left sided flank pain radiating down to groin. Endorsing increased urinary frequency but denies pain with urination. Last BM today- no abnormalities. Denies fevers, chills, nausea, vomiting.

## 2024-11-21 NOTE — ED Provider Notes (Signed)
 "  [facility] Provider Note    Event Date/Time   First MD Initiated Contact with Patient 11/21/24 [phone]     (approximate)   History   Flank Pain   HPI  Bradley Quinn is a 48 y.o. male with history of hypertension not currently on any medication, GERD, ADD, anxiety, PTSD who presents to the emergency department for left flank pain that radiates into the left scrotum and testicle.  Has had nausea.  No fevers, dysuria or discharge.  Never had similar symptoms.  No history of kidney stone.   History provided by patient and wife.    Past Medical History:  Diagnosis Date   ADD (attention deficit disorder)    Anxiety    GERD (gastroesophageal reflux disease)    Hypertension    PTSD (post-traumatic stress disorder)     Past Surgical History:  Procedure Laterality Date   APPENDECTOMY     COLONOSCOPY WITH PROPOFOL  N/A 05/22/2020   Procedure: COLONOSCOPY WITH PROPOFOL ;  Surgeon: Unk Corinn Skiff, MD;  Location: [facility] ENDOSCOPY;  Service: Gastroenterology;  Laterality: N/A;   ESOPHAGOGASTRODUODENOSCOPY (EGD) WITH PROPOFOL  N/A 05/22/2020   Procedure: ESOPHAGOGASTRODUODENOSCOPY (EGD) WITH PROPOFOL ;  Surgeon: Unk Corinn Skiff, MD;  Location: [facility] ENDOSCOPY;  Service: Gastroenterology;  Laterality: N/A;   LEFT HEART CATH AND CORONARY ANGIOGRAPHY N/A 05/20/2020   Procedure: LEFT HEART CATH AND CORONARY ANGIOGRAPHY possible PCI and stent;  Surgeon: Florencio Cara BIRCH, MD;  Location: [facility] INVASIVE CV LAB;  Service: Cardiovascular;  Laterality: N/A;    MEDICATIONS:  Prior to Admission medications  Medication Sig Start Date End Date Taking? Authorizing Provider  amphetamine -dextroamphetamine  (ADDERALL) 15 MG tablet Take 2 tablets by mouth 2 (two) times daily. 04/01/20   [provider]  ASPIRIN  LOW DOSE 81 MG EC tablet Take 81 mg by mouth daily. 07/19/21   [provider]  atorvastatin  (LIPITOR ) 80 MG tablet Take 1 tablet (80 mg total) by mouth at  bedtime. 05/23/20   Mikhail, Maryann, DO  butalbital -acetaminophen -caffeine  (FIORICET ) 50-325-40 MG tablet Take 1 tablet by mouth every 6 (six) hours as needed for headache. 05/23/20   Mikhail, Maryann, DO  FLUoxetine  (PROZAC ) 20 MG capsule Take 20 mg by mouth daily. 04/30/20   [provider]  isosorbide  mononitrate (IMDUR ) 30 MG 24 hr tablet Take 1 tablet (30 mg total) by mouth daily. 05/23/20   Mikhail, Maryann, DO  metoprolol  tartrate (LOPRESSOR ) 25 MG tablet Take 1 tablet (25 mg total) by mouth 2 (two) times daily. 05/23/20   Mikhail, Maryann, DO  mupirocin  ointment (BACTROBAN ) 2 % Apply 1 application. topically daily. Qd to excision site 03/28/22   Hester Alm BROCKS, MD  pantoprazole  (PROTONIX ) 40 MG tablet Take 1 tablet (40 mg total) by mouth 2 (two) times daily before a meal. 05/23/20   Jeannett Liming, DO    Physical Exam   Triage Vital Signs: ED Triage Vitals  Encounter Vitals Group     BP 11/21/24 0028 (!) 193/121     Girls Systolic BP Percentile --      Girls Diastolic BP Percentile --      Boys Systolic BP Percentile --      Boys Diastolic BP Percentile --      Pulse Rate 11/21/24 0028 79     Resp 11/21/24 0028 18     Temp 11/21/24 0028 98.1 F (36.7 C)     Temp Source 11/21/24 0028 Oral     SpO2 11/21/24 0028 98 %  Weight 11/21/24 0027 200 lb (90.7 kg)     Height 11/21/24 0027 5' 11 (1.803 m)     Head Circumference --      Peak Flow --      Pain Score 11/21/24 0033 5     Pain Loc --      Pain Education --      Exclude from Growth Chart --     Most recent vital signs: Vitals:   11/21/24 0326 11/21/24 0529  BP: (!) 214/128 (!) 196/120  Pulse: 79 80  Resp: 18 16  Temp: 98 F (36.7 C) (!) 97.4 F (36.3 C)  SpO2: 100% 98%    CONSTITUTIONAL: Alert, responds appropriately to questions.  Appears uncomfortable HEAD: Normocephalic, atraumatic EYES: Conjunctivae clear, pupils appear equal, sclera nonicteric ENT: normal nose; moist mucous membranes NECK:  Supple, normal ROM CARD: RRR; S1 and S2 appreciated RESP: Normal chest excursion without splinting or tachypnea; breath sounds clear and equal bilaterally; no wheezes, no rhonchi, no rales, no hypoxia or respiratory distress, speaking full sentences ABD/GI: Non-distended; soft, non-tender, no rebound, no guarding, no peritoneal signs GU:  Normal external genitalia, circumcised male, normal penile shaft, no blood or discharge at the urethral meatus, no testicular masses, no RIGHT testicular tenderness on exam, no scrotal masses or swelling, no hernias appreciated, 2+ femoral pulses bilaterally; no perineal erythema, warmth, subcutaneous air or crepitus; no high riding testicle, normal bilateral cremasteric reflex.  Patient is tender over the left testicle and epididymis.SABRA BACK: The back appears normal EXT: Normal ROM in all joints; no deformity noted, no edema SKIN: Normal color for age and race; warm; no rash on exposed skin NEURO: Moves all extremities equally, normal speech PSYCH: The patient's mood and manner are appropriate.   ED Results / Procedures / Treatments   LABS: (all labs ordered are listed, but only abnormal results are displayed) Labs Reviewed  URINALYSIS, ROUTINE W REFLEX MICROSCOPIC - Abnormal; Notable for the following components:      Result Value   Color, Urine STRAW (*)    APPearance CLEAR (*)    All other components within normal limits  BASIC METABOLIC PANEL WITH GFR - Abnormal; Notable for the following components:   Glucose, Bld 116 (*)    All other components within normal limits  CHLAMYDIA/NGC RT PCR ([facility] ONLY)            CBC     EKG:  EKG Interpretation Date/Time:    Ventricular Rate:    PR Interval:    QRS Duration:    QT Interval:    QTC Calculation:   R Axis:      Text Interpretation:           RADIOLOGY: My personal review and interpretation of imaging: CT scan shows nephrolithiasis but no ureterolithiasis or hydronephrosis.  No  diverticulosis or diverticulitis.  Ultrasound shows no torsion.  I have personally reviewed all radiology reports.   US  SCROTUM W/DOPPLER Result Date: 11/21/2024 EXAM: ULTRASOUND SCROTUM/TESTICLES WITH DOPPLER FLOW EVALUATION 11/21/2024 04:33:00 AM TECHNIQUE: Duplex ultrasound using B-mode/gray scaled imaging, Doppler spectral analysis and color flow Doppler was obtained of the testicles. COMPARISON: Non-contrast CT abdomen and pelvis 11/21/2024 04:33:00 AM reported separately. CLINICAL HISTORY: 48 year old male with left testicular pain. FINDINGS: RIGHT: GREY SCALE: The right testicle measures 3.8 x 2.0 x 2.2 cm. It demonstrates normal homogeneous echotexture without focal lesion. No testicular microlithiasis. DOPPLER EVALUATION: There is normal arterial and venous Doppler flow within the testicle. VARICOCELE: No scrotal varicocele.  SCROTAL SAC: Right sided hydrocele is small to moderate with mild echogenic debris (image 12). EPIDIDYMIS: The right epididymis measures 1.3 x 1.9 x 1.6 cm. Large but simple appearing epididymal head cyst (13 mm), normal variant. No hypervascularity. LEFT: GREY SCALE: The left testicle measures 4.0 x 2.0 x 2.2 cm. It demonstrates normal homogeneous echotexture without focal lesion. No testicular microlithiasis. DOPPLER EVALUATION: There is normal arterial and venous Doppler flow within the testicle. VARICOCELE: No scrotal varicocele. SCROTAL SAC: No hydrocele. EPIDIDYMIS: The left epididymis measures 0.8 x 1.0 x 1.1 cm. No hypervascularity. No acute abnormality. IMPRESSION: 1. Negative for testicular mass or torsion. 2. Small to moderate right hydrocele with some echogenic debris, may be postinflammatory. Electronically signed by: Helayne Hurst MD 11/21/2024 04:59 AM EST RP Workstation: [id number]   CT Renal Stone Study Result Date: 11/21/2024 EXAM: CT UROGRAM 11/21/2024 01:13:43 AM TECHNIQUE: CT of the abdomen and pelvis was performed without the administration of intravenous  contrast as per CT urogram protocol. Multiplanar reformatted images as well as MIP urogram images are provided for review. Automated exposure control, iterative reconstruction, and/or weight based adjustment of the mA/kV was utilized to reduce the radiation dose to as low as reasonably achievable. COMPARISON: Comparison with 07/12/2012. CLINICAL HISTORY: Abdominal/flank pain, stone suspected. FINDINGS: LOWER CHEST: No acute abnormality. LIVER: The liver is unremarkable. GALLBLADDER AND BILE DUCTS: Gallbladder is unremarkable. No biliary ductal dilatation. SPLEEN: No acute abnormality. PANCREAS: No acute abnormality. ADRENAL GLANDS: No acute abnormality. KIDNEYS, URETERS AND BLADDER: Punctate nonobstructing bilateral nephrolithiasis. No stones in the ureters. No hydronephrosis. No perinephric or periureteral stranding. Urinary bladder is unremarkable. GI AND BOWEL: Stomach demonstrates no acute abnormality. Colonic diverticulosis without evidence of diverticulitis. Appendectomy. There is no bowel obstruction. PERITONEUM AND RETROPERITONEUM: No ascites. No free air. VASCULATURE: Aortic atherosclerotic calcification. LYMPH NODES: No lymphadenopathy. REPRODUCTIVE ORGANS: No acute abnormality. BONES AND SOFT TISSUES: No acute osseous abnormality. No focal soft tissue abnormality. IMPRESSION: 1. No acute abnormality in the abdomen or pelvis. 2.  Punctate nonobstructing bilateral nephrolithiasis. Electronically signed by: Norman Gatlin MD 11/21/2024 01:18 AM EST RP Workstation: [id number]     PROCEDURES:  Critical Care performed: No     Procedures    IMPRESSION / MDM / ASSESSMENT AND PLAN / ED COURSE  I reviewed the triage vital signs and the nursing notes.    Patient here with complaints of left flank pain that radiates into the left testicle.     DIFFERENTIAL DIAGNOSIS (includes but not limited to):   Kidney stone, UTI, pyelonephritis, epididymitis, testicular torsion, orchitis, STI,  diverticulitis, colitis, doubt aortic aneurysm, dissection   Patient's presentation is most consistent with acute presentation with potential threat to life or bodily function.   PLAN: Workup initiated from triage.  Normal hemoglobin, creatinine.  Urine shows no sign of infection or blood.  Gonorrhea and Chlamydia negative.  CT scan reviewed and interpreted by myself the radiologist and shows punctate stones in both kidneys but no ureterolithiasis, hydronephrosis.  Discussed with patient that he could have passed a stone but given he is tender over the left testicle and epididymis, will obtain ultrasound to rule out torsion.  Will also go ahead and treat for epididymitis.  Will give pain medication.   MEDICATIONS GIVEN IN ED: Medications  ondansetron  (ZOFRAN -ODT) disintegrating tablet 4 mg (4 mg Oral Given 11/21/24 0331)  oxyCODONE -acetaminophen  (PERCOCET/ROXICET) 5-325 MG per tablet 2 tablet (2 tablets Oral Given 11/21/24 0332)  levofloxacin  (LEVAQUIN ) tablet 750 mg (750 mg Oral Given 11/21/24 0526)  amLODipine  (NORVASC ) tablet 5 mg (5 mg Oral Given 11/21/24 0537)     ED COURSE: Ultrasound reviewed and interpreted by myself and the radiologist and shows no testicular mass, torsion.  He reports feeling better.  He is hypertensive here but has no headache, vision changes, chest pain, shortness of breath, numbness, tingling or focal weakness.  Will start him on amlodipine .  He does have a PCP for close follow-up.  Encouraged him to schedule an appointment.  Will discharge with prescription of amlodipine  as well.  No indication for further emergent workup for his hypertension given he is asymptomatic from it.   At this time, I do not feel there is any life-threatening condition present. I reviewed all nursing notes, vitals, pertinent previous records.  All lab and urine results, EKGs, imaging ordered have been independently reviewed and interpreted by myself.  I reviewed all available radiology  reports from any imaging ordered this visit.  Based on my assessment, I feel the patient is safe to be discharged home without further emergent workup and can continue workup as an outpatient as needed. Discussed all findings, treatment plan as well as usual and customary return precautions.  They verbalize understanding and are comfortable with this plan.  Outpatient follow-up has been provided as needed.  All questions have been answered.    CONSULTS:  none   OUTSIDE RECORDS REVIEWED: Reviewed last dermatology notes in 2023.       FINAL CLINICAL IMPRESSION(S) / ED DIAGNOSES   Final diagnoses:  Epididymitis  Uncontrolled hypertension     Rx / DC Orders   ED Discharge Orders          Ordered    oxyCODONE  (ROXICODONE ) 5 MG immediate release tablet  Every 8 hours PRN        11/21/24 0513    ondansetron  (ZOFRAN -ODT) 4 MG disintegrating tablet  Every 6 hours PRN        11/21/24 0513    ibuprofen  (ADVIL ) 800 MG tablet  Every 8 hours PRN        11/21/24 0513    levofloxacin  (LEVAQUIN ) 500 MG tablet  Daily        11/21/24 0513    amLODipine  (NORVASC ) 5 MG tablet  Daily        11/21/24 0533             Note:  This document was prepared using Dragon voice recognition software and may include unintentional dictation errors.   Ethridge Sollenberger, Josette SAILOR, DO 11/21/24 [phone]  "

## 2024-11-21 NOTE — ED Notes (Signed)
US completed at bedside.

## 2024-11-21 NOTE — Discharge Instructions (Addendum)
 You may alternate over the counter Tylenol  1000 mg every 6 hours as needed for pain, fever and Ibuprofen  800 mg every 6-8 hours as needed for pain, fever.  Please take Ibuprofen  with food.  Do not take more than 4000 mg of Tylenol  (acetaminophen ) in a 24 hour period.  You are being provided a prescription for opiates (also known as narcotics) for pain control.  Opiates can be addictive and should only be used when absolutely necessary for pain control when other alternatives do not work.  We recommend you only use them for the recommended amount of time and only as prescribed.  Please do not take with other sedative medications or alcohol.  Please do not drive, operate machinery, make important decisions while taking opiates.  Please note that these medications can be addictive and have high abuse potential.  Patients can become addicted to narcotics after only taking them for a few days.  Please keep these medications locked away from children, teenagers or any family members with history of substance abuse.  Narcotic pain medicine may also make you constipated.  You may use over-the-counter medications such as MiraLAX, Colace to prevent constipation.  If you become constipated, you may use over-the-counter enemas as needed.  Itching and nausea are also common side effects of narcotic pain medication.  If you develop uncontrolled vomiting or a rash, please stop these medications and seek medical care.  I recommend close follow-up with urology for what I think is likely epididymitis.  Ultrasound today was normal.  He also had stones within your kidneys and you could have passed a stone which could have caused some of this pain.  Urology would be appropriate to follow-up for this as well.  He also had very elevated high blood pressure here.  We are starting you on medication for this.  Please return to the emergency department if you have severe headache, vision changes, loss of vision, numbness or weakness on  one side of your body, chest pain or shortness of breath.

## 2024-12-04 ENCOUNTER — Ambulatory Visit: Admitting: Family Medicine

## 2024-12-04 ENCOUNTER — Encounter: Payer: Self-pay | Admitting: Family Medicine

## 2024-12-04 VITALS — BP 186/110 | HR 88 | Temp 97.9°F | Ht 71.0 in | Wt 200.0 lb

## 2024-12-04 DIAGNOSIS — F419 Anxiety disorder, unspecified: Secondary | ICD-10-CM

## 2024-12-04 DIAGNOSIS — F3341 Major depressive disorder, recurrent, in partial remission: Secondary | ICD-10-CM | POA: Diagnosis not present

## 2024-12-04 DIAGNOSIS — R5383 Other fatigue: Secondary | ICD-10-CM

## 2024-12-04 DIAGNOSIS — Z7689 Persons encountering health services in other specified circumstances: Secondary | ICD-10-CM

## 2024-12-04 DIAGNOSIS — R41840 Attention and concentration deficit: Secondary | ICD-10-CM

## 2024-12-04 DIAGNOSIS — R21 Rash and other nonspecific skin eruption: Secondary | ICD-10-CM

## 2024-12-04 DIAGNOSIS — Z6827 Body mass index (BMI) 27.0-27.9, adult: Secondary | ICD-10-CM

## 2024-12-04 DIAGNOSIS — M2550 Pain in unspecified joint: Secondary | ICD-10-CM | POA: Diagnosis not present

## 2024-12-04 DIAGNOSIS — K219 Gastro-esophageal reflux disease without esophagitis: Secondary | ICD-10-CM | POA: Diagnosis not present

## 2024-12-04 DIAGNOSIS — I10 Essential (primary) hypertension: Secondary | ICD-10-CM | POA: Diagnosis not present

## 2024-12-04 DIAGNOSIS — E663 Overweight: Secondary | ICD-10-CM | POA: Diagnosis not present

## 2024-12-04 DIAGNOSIS — Z1159 Encounter for screening for other viral diseases: Secondary | ICD-10-CM

## 2024-12-04 MED ORDER — TRIAMCINOLONE ACETONIDE 0.5 % EX CREA: 1.0000 | TOPICAL_CREAM | Freq: Two times a day (BID) | CUTANEOUS | 0 refills | Status: AC

## 2024-12-04 NOTE — Progress Notes (Unsigned)
 "  New Patient Office Visit  Subjective   Patient ID: Bradley Quinn, male    DOB: Mar 30, 1976  Age: 49 y.o. MRN: [id number]  CC:  Chief Complaint  Patient presents with   Establish Care    HPI Bradley Quinn presents to establish care with new provider.  Patients previous primary care provider: [facility] with Bradley Shine, NP.   Specialist: None   HTN: Chronic. Patient is taking Metoprolol  Tartrate 25mg  BID. Patient has a prescription of Amlodipine  5 mg from recent ED visit. Been monitoring blood pressure at home the last two weeks 167-190/100-103. Been having dizziness, chest pain, lightheadedness, and only once having lower extremity edema. Denies shortness of breath.   He was seen at [facility] ED at [facility] on 11/21/2024 for uncontrolled HTN and left flank pain that radiated to his left scrotum and testicle. Urinalysis completed with no sign of infection or blood. Gonorrhea and chlamydia negative. Elevated glucose finding on BMP. CT renal stone completed with no acute abnormalities in the abdomen or pelvis. Punctate non obstructing bilateral nephrolithiasis. Scrotum ultrasound completed with no testicular mass or torsion. Small to moderate right hydrocele with some echogenic debris, may be postinflammatory. Went a head with treatment for epididymitis with Levofloxacin . Also, prescribed Amlodipine  5 mg for blood pressure.   Angina: Patient is taking Isosorbide  mononitrate 30mg  daily. Previous primary care has been prescribing. However, it was initially started back in 04/2020 from a hospital admission for Hematemesis/ melena/ GI bleed/symptomatic anemia/acute blood loss, GERD, depression Essential hypertension Chest pain/abnormal EKG- Unstable angina Left hip pain ADD THC abuse Headache  GERD: Chronic. Patient is taking Pantoprazole  40mg  BID. Effective   ADHD: Chronic. Patient is taking Adderall 30mg  BID and Adderall 5mg  BID. Effective.   Depression/Anxiety: Chronic.  Patient is taking Fluoxetine  20mg  daily. Effective.     Outpatient Encounter Medications as of 12/04/2024  Medication Sig   amphetamine -dextroamphetamine  (ADDERALL) 30 MG tablet Take 30 mg by mouth 2 (two) times daily.   amphetamine -dextroamphetamine  (ADDERALL) 5 MG tablet Take 5 mg by mouth in the morning and at bedtime.   ASPIRIN  LOW DOSE 81 MG EC tablet Take 81 mg by mouth daily.   FLUoxetine  (PROZAC ) 20 MG capsule Take 20 mg by mouth daily.   ibuprofen  (ADVIL ) 800 MG tablet Take 1 tablet (800 mg total) by mouth every 8 (eight) hours as needed.   isosorbide  mononitrate (IMDUR ) 30 MG 24 hr tablet Take 1 tablet (30 mg total) by mouth daily.   metoprolol  tartrate (LOPRESSOR ) 25 MG tablet Take 1 tablet (25 mg total) by mouth 2 (two) times daily.   ondansetron  (ZOFRAN -ODT) 4 MG disintegrating tablet Take 1 tablet (4 mg total) by mouth every 6 (six) hours as needed for nausea or vomiting.   pantoprazole  (PROTONIX ) 40 MG tablet Take 1 tablet (40 mg total) by mouth 2 (two) times daily before a meal.   amLODipine  (NORVASC ) 5 MG tablet Take 1 tablet (5 mg total) by mouth daily. (Patient not taking: Reported on 12/04/2024)   [DISCONTINUED] amphetamine -dextroamphetamine  (ADDERALL) 15 MG tablet Take 2 tablets by mouth 2 (two) times daily.   [DISCONTINUED] atorvastatin  (LIPITOR ) 80 MG tablet Take 1 tablet (80 mg total) by mouth at bedtime.   [DISCONTINUED] butalbital -acetaminophen -caffeine  (FIORICET ) 50-325-40 MG tablet Take 1 tablet by mouth every 6 (six) hours as needed for headache.   [DISCONTINUED] mupirocin  ointment (BACTROBAN ) 2 % Apply 1 application. topically daily. Qd to excision site   [DISCONTINUED] oxyCODONE  (ROXICODONE ) 5 MG immediate release  tablet Take 1 tablet (5 mg total) by mouth every 8 (eight) hours as needed.   No facility-administered encounter medications on file as of 12/04/2024.    Past Medical History:  Diagnosis Date   ADD (attention deficit disorder)    Anxiety     Depression    GERD (gastroesophageal reflux disease)    Hypertension    PTSD (post-traumatic stress disorder)     Past Surgical History:  Procedure Laterality Date   APPENDECTOMY     COLONOSCOPY WITH PROPOFOL  N/A 05/22/2020   Procedure: COLONOSCOPY WITH PROPOFOL ;  Surgeon: Unk Bradley Skiff, MD;  Location: [facility] ENDOSCOPY;  Service: Gastroenterology;  Laterality: N/A;   ESOPHAGOGASTRODUODENOSCOPY (EGD) WITH PROPOFOL  N/A 05/22/2020   Procedure: ESOPHAGOGASTRODUODENOSCOPY (EGD) WITH PROPOFOL ;  Surgeon: Unk Bradley Skiff, MD;  Location: [facility] ENDOSCOPY;  Service: Gastroenterology;  Laterality: N/A;   LEFT HEART CATH AND CORONARY ANGIOGRAPHY N/A 05/20/2020   Procedure: LEFT HEART CATH AND CORONARY ANGIOGRAPHY possible PCI and stent;  Surgeon: Bradley Cara BIRCH, MD;  Location: [facility] INVASIVE CV LAB;  Service: Cardiovascular;  Laterality: N/A;    Family History  Problem Relation Age of Onset   Lung cancer Father    Prostate cancer Father    Liver cancer Father    Kidney Stones Sister    Kidney Stones Brother     Social History   Socioeconomic History   Marital status: Married    Spouse name: Not on file   Number of children: 2   Years of education: Not on file   Highest education level: 9th grade  Occupational History   Not on file  Tobacco Use   Smoking status: Never   Smokeless tobacco: Never  Vaping Use   Vaping status: Never Used  Substance and Sexual Activity   Alcohol use: Never   Drug use: No   Sexual activity: Yes    Birth control/protection: None  Other Topics Concern   Not on file  Social History Narrative   Not on file   Social Drivers of Health   Tobacco Use: Low Risk (12/04/2024)   Patient History    Smoking Tobacco Use: Never    Smokeless Tobacco Use: Never    Passive Exposure: Not on file  Financial Resource Strain: Patient Declined (12/04/2024)   Overall Financial Resource Strain (CARDIA)    Difficulty of Paying Living  Expenses: Patient declined  Food Insecurity: Patient Declined (12/04/2024)   Epic    Worried About Programme Researcher, Broadcasting/film/video in the Last Year: Patient declined    Barista in the Last Year: Patient declined  Transportation Needs: Patient Declined (12/04/2024)   Epic    Lack of Transportation (Medical): Patient declined    Lack of Transportation (Non-Medical): Patient declined  Physical Activity: Patient Declined (12/04/2024)   Exercise Vital Sign    Days of Exercise per Week: Patient declined    Minutes of Exercise per Session: Patient declined  Stress: Patient Declined (12/04/2024)   [employer] of Occupational Health - Occupational Stress Questionnaire    Feeling of Stress: Patient declined  Social Connections: Moderately Isolated (12/04/2024)   Social Connection and Isolation Panel    Frequency of Communication with Friends and Family: More than three times a week    Frequency of Social Gatherings with Friends and Family: More than three times a week    Attends Religious Services: Never    Database Administrator or Organizations: No    Attends Banker Meetings: Never  Marital Status: Married  Catering Manager Violence: Not At Risk (12/04/2024)   Epic    Fear of Current or Ex-Partner: No    Emotionally Abused: No    Physically Abused: No    Sexually Abused: No  Depression (PHQ2-9): Low Risk (12/04/2024)   Depression (PHQ2-9)    PHQ-2 Score: 4  Alcohol Screen: Low Risk (12/04/2024)   Alcohol Screen    Last Alcohol Screening Score (AUDIT): 0  Housing: Patient Declined (12/04/2024)   Epic    Unable to Pay for Housing in the Last Year: Patient declined    Number of Times Moved in the Last Year: Not on file    Homeless in the Last Year: Patient declined  Utilities: Not At Risk (12/04/2024)   Epic    Threatened with loss of utilities: No  Health Literacy: Adequate Health Literacy (12/04/2024)   B1300 Health Literacy    Frequency of need for help with  medical instructions: Never    ROS See HPI above    Objective  BP (!) 190/110   Pulse 88   Temp 97.9 F (36.6 C) (Oral)   Ht 5' 11 (1.803 m)   Wt 200 lb (90.7 kg)   SpO2 98%   BMI 27.89 kg/m  BP Readings from Last 3 Encounters:  12/04/24 (!) 190/110  11/21/24 (!) 175/95  05/23/20 (!) 150/91    Physical Exam    Assessment & Plan:  There are no diagnoses linked to this encounter.  No follow-ups on file.   Danial Hlavac, NP "

## 2024-12-05 ENCOUNTER — Ambulatory Visit: Payer: Self-pay | Admitting: Family Medicine

## 2024-12-05 DIAGNOSIS — R7689 Other specified abnormal immunological findings in serum: Secondary | ICD-10-CM

## 2024-12-05 DIAGNOSIS — E559 Vitamin D deficiency, unspecified: Secondary | ICD-10-CM

## 2024-12-05 DIAGNOSIS — E876 Hypokalemia: Secondary | ICD-10-CM

## 2024-12-05 LAB — TSH: TSH: 3.21 u[IU]/mL (ref 0.35–5.50)

## 2024-12-05 LAB — COMPREHENSIVE METABOLIC PANEL WITH GFR
ALT: 43 U/L (ref 3–53)
AST: 36 U/L (ref 5–37)
Albumin: 4.7 g/dL (ref 3.5–5.2)
Alkaline Phosphatase: 78 U/L (ref 39–117)
BUN: 8 mg/dL (ref 6–23)
CO2: 33 meq/L — ABNORMAL HIGH (ref 19–32)
Calcium: 9.8 mg/dL (ref 8.4–10.5)
Chloride: 97 meq/L (ref 96–112)
Creatinine, Ser: 1.17 mg/dL (ref 0.40–1.50)
GFR: 74.15 mL/min
Glucose, Bld: 79 mg/dL (ref 70–99)
Potassium: 3 meq/L — ABNORMAL LOW (ref 3.5–5.1)
Sodium: 138 meq/L (ref 135–145)
Total Bilirubin: 0.6 mg/dL (ref 0.2–1.2)
Total Protein: 7.6 g/dL (ref 6.0–8.3)

## 2024-12-05 LAB — VITAMIN B12: Vitamin B-12: 310 pg/mL (ref 211–911)

## 2024-12-05 LAB — TESTOSTERONE: Testosterone: 189.87 ng/dL — ABNORMAL LOW (ref 300.00–890.00)

## 2024-12-05 LAB — VITAMIN D 25 HYDROXY (VIT D DEFICIENCY, FRACTURES): VITD: 20.67 ng/mL — ABNORMAL LOW (ref 30.00–100.00)

## 2024-12-05 LAB — HEMOGLOBIN A1C: Hgb A1c MFr Bld: 6.3 % (ref 4.6–6.5)

## 2024-12-05 LAB — SEDIMENTATION RATE: Sed Rate: 11 mm/h (ref 0–15)

## 2024-12-05 MED ORDER — VITAMIN D (ERGOCALCIFEROL) 1.25 MG (50000 UNIT) PO CAPS: 50000.0000 [IU] | ORAL_CAPSULE | ORAL | 0 refills | Status: AC

## 2024-12-05 MED ORDER — POTASSIUM CHLORIDE CRYS ER 10 MEQ PO TBCR: 10.0000 meq | EXTENDED_RELEASE_TABLET | Freq: Two times a day (BID) | ORAL | 0 refills | Status: DC

## 2024-12-07 LAB — HEPATITIS C ANTIBODY: Hepatitis C Ab: NONREACTIVE

## 2024-12-07 LAB — ANTI-NUCLEAR AB-TITER (ANA TITER)
ANA TITER: 1:1280 {titer} — ABNORMAL HIGH
ANA Titer 1: 1:320 {titer} — ABNORMAL HIGH

## 2024-12-07 LAB — ANA: Anti Nuclear Antibody (ANA): POSITIVE — AB

## 2024-12-07 LAB — CYCLIC CITRUL PEPTIDE ANTIBODY, IGG: Cyclic Citrullin Peptide Ab: <16 U

## 2024-12-07 LAB — RHEUMATOID FACTOR: Rheumatoid fact SerPl-aCnc: <10 [IU]/mL

## 2024-12-08 NOTE — Addendum Note (Signed)
 Addended by: ELNER NANNY B on: 12/08/2024 04:29 PM   Modules accepted: Orders

## 2024-12-09 DIAGNOSIS — F419 Anxiety disorder, unspecified: Secondary | ICD-10-CM | POA: Insufficient documentation

## 2024-12-09 NOTE — Assessment & Plan Note (Signed)
 Continue taking Pantoprazole  40mg  BID. After visit, reviewed chart more throughly. At next visit, need to discuss if patient followed up with GI from 04/2020 being admitted.

## 2024-12-09 NOTE — Assessment & Plan Note (Signed)
 Scored 4 on PHQ-9 and 6 on GAD-7. Continue taking Fluoxetine  20mg  daily. Effective.

## 2024-12-09 NOTE — Assessment & Plan Note (Signed)
-  Blood pressure significantly elevated. Recommend to continue taking Metoprolol , but recommend to start back taking Amlodpine 5 mg from most recent emergency department visit. Discussed about side effects of medication.  *Recommend to monitor blood pressure twice a day with an upper arm cuff. Record readings and bring to a 2 week follow up. *Provided general information about a DASH diet-low sodium  *Discussed about hypertension being the silent killer and signs/symptoms of a stroke-emergent care  *Ordered CMP

## 2024-12-09 NOTE — Assessment & Plan Note (Signed)
 Continue taking Adderall 30 BID and Adderall 5mg  BID to equal 35mg  BID. At next visit, will need UDS ordered and completed periodically and sign controlled substance contract.

## 2024-12-09 NOTE — Patient Instructions (Addendum)
-  It was nice to meet you and look forward to taking care of you. -Blood pressure significantly elevated. Recommend to continue taking Metoprolol , but recommend to start back taking Amlodpine 5 mg from most recent emergency department visit. Discussed about side effects of medication.  *Recommend to monitor blood pressure twice a day with an upper arm cuff. Record readings and bring to a 2 week follow up. *Provided general information about a DASH diet-low sodium  *Discussed about hypertension being the silent killer and signs/symptoms of a stroke-emergent care  -Continue all other medications. At next visit, will obtain urine sample and sign controlled substance contract to take over management of ADHD.  -Ordered labs. Office will call with results and will be available via MyChart.  -Prescribed Triamcinolone  cream to apply to rash twice a day. Either mix 1:1 combination with Eucerin cream or similar cream and apply a thin layer over the rash.  -Follow up in 2 weeks.

## 2024-12-18 ENCOUNTER — Encounter: Payer: Self-pay | Admitting: Family Medicine

## 2024-12-18 ENCOUNTER — Ambulatory Visit: Admitting: Family Medicine

## 2024-12-18 VITALS — BP 148/84 | HR 75 | Temp 98.2°F | Ht 71.0 in | Wt 203.0 lb

## 2024-12-18 DIAGNOSIS — I10 Essential (primary) hypertension: Secondary | ICD-10-CM | POA: Diagnosis not present

## 2024-12-18 DIAGNOSIS — R7303 Prediabetes: Secondary | ICD-10-CM | POA: Diagnosis not present

## 2024-12-18 DIAGNOSIS — E876 Hypokalemia: Secondary | ICD-10-CM

## 2024-12-18 DIAGNOSIS — R7989 Other specified abnormal findings of blood chemistry: Secondary | ICD-10-CM

## 2024-12-18 DIAGNOSIS — E663 Overweight: Secondary | ICD-10-CM

## 2024-12-18 DIAGNOSIS — R079 Chest pain, unspecified: Secondary | ICD-10-CM

## 2024-12-18 DIAGNOSIS — E559 Vitamin D deficiency, unspecified: Secondary | ICD-10-CM | POA: Diagnosis not present

## 2024-12-18 DIAGNOSIS — R21 Rash and other nonspecific skin eruption: Secondary | ICD-10-CM

## 2024-12-18 DIAGNOSIS — K2100 Gastro-esophageal reflux disease with esophagitis, without bleeding: Secondary | ICD-10-CM

## 2024-12-18 DIAGNOSIS — R41840 Attention and concentration deficit: Secondary | ICD-10-CM

## 2024-12-18 LAB — LIPID PANEL
Cholesterol: 268 mg/dL — ABNORMAL HIGH (ref 28–200)
HDL: 39.4 mg/dL
LDL Cholesterol: 157 mg/dL — ABNORMAL HIGH (ref 10–99)
NonHDL: 228.93
Total CHOL/HDL Ratio: 7
Triglycerides: 362 mg/dL — ABNORMAL HIGH (ref 10.0–149.0)
VLDL: 72.4 mg/dL — ABNORMAL HIGH (ref 0.0–40.0)

## 2024-12-18 LAB — BASIC METABOLIC PANEL WITH GFR
BUN: 14 mg/dL (ref 6–23)
CO2: 32 meq/L (ref 19–32)
Calcium: 9.7 mg/dL (ref 8.4–10.5)
Chloride: 98 meq/L (ref 96–112)
Creatinine, Ser: 1.2 mg/dL (ref 0.40–1.50)
GFR: 71.92 mL/min
Glucose, Bld: 119 mg/dL — ABNORMAL HIGH (ref 70–99)
Potassium: 3.4 meq/L — ABNORMAL LOW (ref 3.5–5.1)
Sodium: 138 meq/L (ref 135–145)

## 2024-12-18 LAB — TESTOSTERONE: Testosterone: 215.46 ng/dL — ABNORMAL LOW (ref 300.00–890.00)

## 2024-12-18 NOTE — Progress Notes (Signed)
 "  Established Patient Office Visit   Subjective:  Patient ID: Bradley Quinn, male    DOB: 30-Apr-1976  Age: 49 y.o. MRN: [id number]  Chief Complaint  Patient presents with   Medical Management of Chronic Issues    2 week follow up     HPI HTN: On previous appointment, recommended to start Amlodipine  5mg  daily while still taking Metoprolol  Tartrate 25mg  BID. Patient reports he has been hesitate about starting Amlodipine . He had tried it for a brief time before and felt like he had side effects from the medication. He was still having elevated readings with symptoms after last appointment. He decided to start taking Amlodipine  on 01/19. He reports he can tell a difference with adding the Amlodipine . Since 01/19, BP reading 108-162/74-111.  BP Readings from Last 3 Encounters:  12/18/24 (!) 148/84  12/04/24 (!) 186/110  11/21/24 (!) 175/95    Rash: On previous appointment, prescribed Triamcinolone  cream to help rash on hands, elbow, and knees. Improved.   ADHD: ADHD: Chronic. Patient is taking Adderall 30mg  BID and Adderall 5mg  BID. Effective. At this appointment, needs UDS and controlled substance contract to transition management of these medications.   Vitamin D  deficiency: Vitamin D  was 20.67, recommended to start 50,000IU tablet every 7 days with a recheck in 3 months.   Patient had a low testosterone , 189.87, at last visit. He needs a second recheck to confirm low testosterone .   Potassium was low, 3.0, at last visit on 12/05/2023. He was recommended to take Potassium Chloride  10mEq tablet for 5 days and to recheck today. He reports he only took one dose of the potassium. He reports he had a rash developed on abd that has resolved. Also, felt dizzy and flushed after taking only one dose.   ROS See HPI above     Objective:   BP (!) 148/84   Pulse 75   Temp 98.2 F (36.8 C) (Oral)   Ht 5' 11 (1.803 m)   Wt 203 lb (92.1 kg)   SpO2 97%   BMI 28.31 kg/m    Physical  Exam Vitals reviewed.  Constitutional:      General: He is not in acute distress.    Appearance: Normal appearance. He is overweight. He is not ill-appearing, toxic-appearing or diaphoretic.  HENT:     Head: Normocephalic and atraumatic.  Eyes:     General:        Right eye: No discharge.        Left eye: No discharge.     Conjunctiva/sclera: Conjunctivae normal.  Cardiovascular:     Rate and Rhythm: Normal rate and regular rhythm.     Heart sounds: Normal heart sounds. No murmur heard.    No friction rub. No gallop.  Pulmonary:     Effort: Pulmonary effort is normal. No respiratory distress.     Breath sounds: Normal breath sounds.  Musculoskeletal:        General: Normal range of motion.  Skin:    General: Skin is warm and dry.  Neurological:     General: No focal deficit present.     Mental Status: He is alert and oriented to person, place, and time. Mental status is at baseline.  Psychiatric:        Mood and Affect: Mood normal.        Behavior: Behavior normal.        Thought Content: Thought content normal.        Judgment: Judgment normal.  Assessment & Plan:  Low blood potassium -     Basic metabolic panel with GFR  Vitamin D  deficiency Assessment & Plan: Taking prescribed supplement and will need to reassess in about 3 months.    Primary hypertension Assessment & Plan: Uncontrolled. Recommend to continue taking Amlodipine  5mg  and Metoprolol  25mg  BID. He just started taking Amlodipine  4 days ago. Recommend to go another 2 weeks with taking both medications to evaluate for side effects and monitoring blood pressure before changing medications.   Orders: -     Ambulatory referral to Cardiology  Rash  Attention or concentration deficit Assessment & Plan: Continue taking Adderall 30 BID and Adderall 5mg  BID to equal 35mg  BID. Ordered UDS to take over management of medication and this will need to be collected periodically. Controlled substance contract  signed and renewed yearly. PDMP reviewed. Last refill was on 01/06 for the 30mg  and 12/31 for the 5mg  by previous PCP.   Orders: -     DRUG MONITOR, PANEL 1, SCREEN, URINE  Low testosterone  in male -     Testosterone   Overweight -     Lipid panel  Gastroesophageal reflux disease with esophagitis without hemorrhage Assessment & Plan: Patent is taking Protonix  BID with no control over his GERD symptoms.   Back in 2021, he was admitted for hematemesis/ melena/ GI bleed/symptomatic anemia/acute blood loss GERD, along with other concerns. He had an  EGD which showed nodular mucosa in the second portion of the duodenum, biopsied.  Nonbleeding duodenal ulcers with a clean base (Forrest class III).  Ectopic gastric mucosa in the upper third of the esophagus.  Recommendation of PPI twice daily. Colonoscopy: was examined and normal. Gastroenterology recommending Protonix  40 mg twice daily long-term, avoidance of NSAIDs. Follow-up with GI in 4 to 6 weeks.However, patient never followed up and still having severe GERD symptoms.      Orders: -     Ambulatory referral to Gastroenterology  Chest pain, unspecified type Assessment & Plan: Patient was admitted back in 2021, atypical chest pain with abnormal EKG, along with other GI concerns. He had a echocardiogram EF 75%, cardiac catheterization- recommended medical therapy, mild-moderate risk for GI procedures at the time. He was to continue atorvastatin  80 mg daily, Imdur  30 mg daily, metoprolol  25 mg twice daily. Follow up with cardiology, however, he never did.. Still having intermittent chest pain, unsure if it is more related to GERD or cardiac. Also, has uncontrolled HTN.   Patient is fasting for visit, will obtain lipid panel with possibly restarting statin.    Orders: -     Ambulatory referral to Cardiology  Prediabetes  -Ordered a second testosterone  with first being low; and BMP to assess potassium level. -Rash has improved, continue  with Triamcinolone  cream. -Patient had a positive ANA and already has an appointment scheduled with rheumatology in March.  -Discussed about prediabetes from last lab collection at last appointment. Discussed about decreasing carbohydrates and sweets. Participating in regular exercise. Directed patient and spouse to visit the American Diabetic Association website for good information on diabetes and to help prevent diabetes.  Return in about 2 weeks (around 01/01/2025) for follow-up.   Izzie Geers, NP "

## 2024-12-19 ENCOUNTER — Ambulatory Visit: Payer: Self-pay | Admitting: Family Medicine

## 2024-12-19 DIAGNOSIS — E559 Vitamin D deficiency, unspecified: Secondary | ICD-10-CM | POA: Insufficient documentation

## 2024-12-19 DIAGNOSIS — E785 Hyperlipidemia, unspecified: Secondary | ICD-10-CM

## 2024-12-19 LAB — DRUG MONITOR, PANEL 1, SCREEN, URINE
Amphetamines: POSITIVE ng/mL — AB
Barbiturates: NEGATIVE ng/mL
Benzodiazepines: NEGATIVE ng/mL
Cocaine Metabolite: NEGATIVE ng/mL
Creatinine: 124.5 mg/dL
Marijuana Metabolite: POSITIVE ng/mL — AB
Methadone Metabolite: NEGATIVE ng/mL
Opiates: NEGATIVE ng/mL
Oxidant: NEGATIVE ug/mL
Oxycodone: NEGATIVE ng/mL
Phencyclidine: NEGATIVE ng/mL
pH: 8 (ref 4.5–9.0)

## 2024-12-19 LAB — DM TEMPLATE

## 2024-12-19 MED ORDER — ROSUVASTATIN CALCIUM 10 MG PO TABS: 10.0000 mg | ORAL_TABLET | Freq: Every day | ORAL | 3 refills | Status: DC

## 2024-12-19 NOTE — Assessment & Plan Note (Signed)
 Patent is taking Protonix  BID with no control over his GERD symptoms.   Back in 2021, he was admitted for hematemesis/ melena/ GI bleed/symptomatic anemia/acute blood loss GERD, along with other concerns. He had an  EGD which showed nodular mucosa in the second portion of the duodenum, biopsied.  Nonbleeding duodenal ulcers with a clean base (Forrest class III).  Ectopic gastric mucosa in the upper third of the esophagus.  Recommendation of PPI twice daily. Colonoscopy: was examined and normal. Gastroenterology recommending Protonix  40 mg twice daily long-term, avoidance of NSAIDs. Follow-up with GI in 4 to 6 weeks.However, patient never followed up and still having severe GERD symptoms.

## 2024-12-19 NOTE — Patient Instructions (Addendum)
-  It was great to see you.  -Continue all medications.  -Continue to monitor your blood pressure with taking Amlodipine  and Metoprolol . Will wait on changing any medications since you have just started Amlodipine .  -Placed a referral to GI-gastroenterology for GERD symptoms and cardiology for continued intermittent chest pain and uncontrolled blood pressure. Please call the office or send a MyChart message if you do not receive a phone call or a MyChart message about appointment in 2 weeks.  -Ordered labs. Office will call with lab results and will be available via MyChart. -Ordered urine drug screen for management of Adderall. This will need to be collected periodically and signed controlled substance contract that will be renewed yearly.  -Discussed about prediabetes from last lab collection at last appointment. Discussed about decreasing carbohydrates and sweets. Participating in regular exercise. Visit the American Diabetic Association website for good information on diabetes and to help prevent diabetes.  -Follow up in 2 weeks.

## 2024-12-19 NOTE — Assessment & Plan Note (Signed)
 Patient was admitted back in 2021, atypical chest pain with abnormal EKG, along with other GI concerns. He had a echocardiogram EF 75%, cardiac catheterization- recommended medical therapy, mild-moderate risk for GI procedures at the time. He was to continue atorvastatin  80 mg daily, Imdur  30 mg daily, metoprolol  25 mg twice daily. Follow up with cardiology, however, he never did.. Still having intermittent chest pain, unsure if it is more related to GERD or cardiac. Also, has uncontrolled HTN.   Patient is fasting for visit, will obtain lipid panel with possibly restarting statin.

## 2024-12-19 NOTE — Assessment & Plan Note (Signed)
 Continue taking Adderall 30 BID and Adderall 5mg  BID to equal 35mg  BID. Ordered UDS to take over management of medication and this will need to be collected periodically. Controlled substance contract signed and renewed yearly. PDMP reviewed. Last refill was on 01/06 for the 30mg  and 12/31 for the 5mg  by previous PCP.

## 2024-12-19 NOTE — Assessment & Plan Note (Signed)
 Taking prescribed supplement and will need to reassess in about 3 months.

## 2024-12-19 NOTE — Assessment & Plan Note (Signed)
 Uncontrolled. Recommend to continue taking Amlodipine  5mg  and Metoprolol  25mg  BID. He just started taking Amlodipine  4 days ago. Recommend to go another 2 weeks with taking both medications to evaluate for side effects and monitoring blood pressure before changing medications.

## 2024-12-25 ENCOUNTER — Ambulatory Visit: Attending: Internal Medicine | Admitting: Internal Medicine

## 2024-12-25 ENCOUNTER — Encounter: Payer: Self-pay | Admitting: Internal Medicine

## 2024-12-25 VITALS — BP 140/94 | HR 70 | Ht 71.0 in | Wt 203.0 lb

## 2024-12-25 DIAGNOSIS — E782 Mixed hyperlipidemia: Secondary | ICD-10-CM | POA: Diagnosis present

## 2024-12-25 DIAGNOSIS — R079 Chest pain, unspecified: Secondary | ICD-10-CM | POA: Insufficient documentation

## 2024-12-25 DIAGNOSIS — E785 Hyperlipidemia, unspecified: Secondary | ICD-10-CM | POA: Diagnosis present

## 2024-12-25 DIAGNOSIS — E876 Hypokalemia: Secondary | ICD-10-CM | POA: Diagnosis present

## 2024-12-25 DIAGNOSIS — I10 Essential (primary) hypertension: Secondary | ICD-10-CM | POA: Diagnosis present

## 2024-12-25 MED ORDER — ROSUVASTATIN CALCIUM 20 MG PO TABS: 20.0000 mg | ORAL_TABLET | Freq: Every day | ORAL | 3 refills | Status: AC

## 2024-12-25 NOTE — Patient Instructions (Addendum)
 Medication Instructions:   START rosuvastatin  20mg  daily for cholesterol    *If you need a refill on your cardiac medications before your next appointment, please call your pharmacy*  Lab Work:  LP(a) today -- first floor  FASTING lipid panel in 3 months -- complete about 1 week before next visit with Dr. Kriste  If you have labs (blood work) drawn today and your tests are completely normal, you will receive your results only by: MyChart Message (if you have MyChart) OR A paper copy in the mail If you have any lab test that is abnormal or we need to change your treatment, we will call you to review the results.  Testing/Procedures:  Your physician has requested that you have an echocardiogram. Echocardiography is a painless test that uses sound waves to create images of your heart. It provides your doctor with information about the size and shape of your heart and how well your hearts chambers and valves are working. This procedure takes approximately one hour. There are no restrictions for this procedure. Please do NOT wear cologne, perfume, aftershave, or lotions (deodorant is allowed). Please arrive 15 minutes prior to your appointment time.  Please note: We ask at that you not bring children with you during ultrasound (echo/ vascular) testing. Due to room size and safety concerns, children are not allowed in the ultrasound rooms during exams. Our front office staff cannot provide observation of children in our lobby area while testing is being conducted. An adult accompanying a patient to their appointment will only be allowed in the ultrasound room at the discretion of the ultrasound technician under special circumstances. We apologize for any inconvenience.  Cardiac PET stress test -- you will get a call to schedule this when approved with insurance  Follow-Up: At [facility], you and your health needs are our priority.  As part of our continuing mission to provide you  with exceptional heart care, our providers are all part of one team.  This team includes your primary Cardiologist (physician) and Advanced Practice Providers or APPs (Physician Assistants and Nurse Practitioners) who all work together to provide you with the care you need, when you need it.  Your next appointment:    3 months with Dr. Kriste    We recommend signing up for the patient portal called MyChart.  Sign up information is provided on this After Visit Summary.  MyChart is used to connect with patients for Virtual Visits (Telemedicine).  Patients are able to view lab/test results, encounter notes, upcoming appointments, etc.  Non-urgent messages can be sent to your provider as well.   To learn more about what you can do with MyChart, go to [email or web address].   Other Instructions     Please report to Radiology at the [facility] Main Entrance 30 minutes early for your test.  [address] [place], [place] [address]                         OR   Please report to Radiology at [facility] Main Entrance, medical mall, 30 mins prior to your test.  [address]  [place], [place]  How to Prepare for Your Cardiac PET/CT Stress Test:  Nothing to eat or drink, except water, 3 hours prior to arrival time.  NO caffeine /decaffeinated products, or chocolate 12 hours prior to arrival. (Please note decaffeinated beverages (teas/coffees) still contain caffeine ).  If you have caffeine  within 12  hours prior, the test will need to be rescheduled.  Medication instructions: Do not take erectile dysfunction medications for 72 hours prior to test (sildenafil, tadalafil) Do not take nitrates (isosorbide  mononitrate, Ranexa) the day before or day of test Do not take tamsulosin the day before or morning of test Hold theophylline containing medications for 12 hours. Hold Dipyridamole 48 hours prior to the test.  Diabetic Preparation: DOES NOT APPLY If  able to eat breakfast prior to 3 hour fasting, you may take all medications, including your insulin. Do not worry if you miss your breakfast dose of insulin - start at your next meal. If you do not eat prior to 3 hour fast-Hold all diabetes (oral and insulin) medications. Patients who wear a continuous glucose monitor MUST remove the device prior to scanning.  You may take your remaining medications with water.  NO perfume, cologne or lotion on chest or abdomen area.  Total time is 1 to 2 hours; you may want to bring reading material for the waiting time.  In preparation for your appointment, medication and supplies will be purchased.  Appointment availability is limited, so if you need to cancel or reschedule, please call the Radiology Department Scheduler at [phone] 24 hours in advance to avoid a cancellation fee of $100.00  What to Expect When you Arrive:  Once you arrive and check in for your appointment, you will be taken to a preparation room within the Radiology Department.  A technologist or Nurse will obtain your medical history, verify that you are correctly prepped for the exam, and explain the procedure.  Afterwards, an IV will be started in your arm and electrodes will be placed on your skin for EKG monitoring during the stress portion of the exam. Then you will be escorted to the PET/CT scanner.  There, staff will get you positioned on the scanner and obtain a blood pressure and EKG.  During the exam, you will continue to be connected to the EKG and blood pressure machines.  A small, safe amount of a radioactive tracer will be injected in your IV to obtain a series of pictures of your heart along with an injection of a stress agent.    After your Exam:  It is recommended that you eat a meal and drink a caffeinated beverage to counter act any effects of the stress agent.  Drink plenty of fluids for the remainder of the day and urinate frequently for the first couple of hours  after the exam.  Your doctor will inform you of your test results within 7-10 business days.  For more information and frequently asked questions, please visit our website: [email or web address]  For questions about your test or how to prepare for your test, please call: Cardiac Imaging Nurse Navigators Office: [phone]  For billing questions, please call [phone].

## 2024-12-25 NOTE — Progress Notes (Signed)
 " Cardiology Office Note:  .   Date:  12/25/2024  ID:  Bradley Quinn, DOB Mar 27, 1976, MRN [id number] PCP: Billy Philippe SAUNDERS, NP  [facility] Providers Cardiologist:  None    History of Present Illness: .     Discussed the use of AI scribe software for clinical note transcription with the patient, who gave verbal consent to proceed.  History of Present Illness He is accompanied by his wife. He was referred by his PCP for chest pain and hypertension management.  Chest pain - Experienced chest pain approximately one month ago, described as feeling like his chest was 'gonna blow up'. - Chest pain was associated with fatigue and dizziness. - Presented to the emergency room around Christmas for chest pain and found to have hypertensive urgency and started on amlodipine  but was hesitant to start until about a week ago - No chest pain in the past one to two weeks since starting a new antihypertensive medication.  Hypertension - Blood pressure reached 213 mmHg approximately one month ago. - Started a amlodipine  about one week ago, resulting in improved blood pressure readings (139-143 mmHg, with occasional readings as low as 124 mmHg). - Current antihypertensive medications include amlodipine  5 mg and metoprolol  tartrate 25 mg once daily. - Discontinued potassium due to rash and dizziness. - Noted a very high salt diet prior to this and was having 2 cans of soup nightly etc.  Coronary artery disease and prior cardiac evaluation - Underwent echocardiogram and left heart catheterization in 2021. - Catheterization findings: minimal luminal irregularities in large left anterior descending and circumflex arteries; tandem 90% lesions in very distal circumflex and a 75% proximal lesion in a small vessel; right coronary artery with minor irregularities and tandem 70% and 90% lesions in a small vessel without collaterals. - Current medications for coronary artery disease include aspirin  81 mg  and Imdur  30 mg.  Hyperlipidemia - Lipid panel from December 18, 2024: total cholesterol 268 mg/dL, HDL 39 mg/dL, LDL 842 mg/dL, triglycerides 637 mg/dL, VLDL 72 mg/dL. - Rosuvastatin  prescribed but not yet started.  Edema - Experienced swelling in legs around Christmas, attributed to high sodium intake.  Dietary and lifestyle modifications - Significantly reduced sodium intake after recognizing high sodium content in foods such as [employer] soup and Alka-Seltzer. - Does not smoke or consume alcohol. - Occasional marijuana use.          ROS: Remaining review of systems negative  Studies Reviewed: SABRA   EKG Interpretation Date/Time:  Thursday December 25 2024 14:15:34 EST Ventricular Rate:  70 PR Interval:  164 QRS Duration:  82 QT Interval:  400 QTC Calculation: 432 R Axis:   -15  Text Interpretation: Normal sinus rhythm Left ventricular hypertrophy with repolarization abnormality ( R in aVL , Cornell product , Romhilt-Estes ) Inferior infarct (cited on or before 19-May-2020) Abnormal ECG When compared with ECG of 19-May-2020 05:12, Sinus rhythm has replaced Ectopic atrial rhythm QRS axis Shifted right Criteria for Lateral infarct are no longer Present Questionable change in initial forces of Inferior leads ST now depressed in Lateral leads T wave inversion more evident in Lateral leads Confirmed by Kriste Hicks [phone]) on 12/25/2024 2:18:16 PM    Results Labs Lipid panel (12/18/2024): Total cholesterol 268, HDL 39, LDL 157, triglycerides 362, VLDL 72  Diagnostic Echocardiogram (05/19/2020): Hyperdynamic left ventricular function, ejection fraction greater than 75%, no regional wall motion abnormalities, no hemodynamically significant valvular disease Left heart catheterization (05/20/2020): Normal left main; large LAD  with minimal luminal irregularities; large circumflex with minor irregularities except for very distal AV circumflex with tandem 90% lesions; very distal OM4 with  75% proximal lesion in a relatively small vessel; RCA large with minor irregularities; mid PDA relatively free of disease; PL with tandem 70% and 90% lesions in a small vessel without collaterals Risk Assessment/Calculations:           Physical Exam:   VS:  BP (!) 140/94   Pulse 70   Ht 5' 11 (1.803 m)   Wt 203 lb (92.1 kg)   SpO2 98%   BMI 28.31 kg/m    Wt Readings from Last 3 Encounters:  12/25/24 203 lb (92.1 kg)  12/18/24 203 lb (92.1 kg)  12/04/24 200 lb (90.7 kg)    GEN: Well nourished, well developed in no acute distress NECK: No JVD; No carotid bruits CARDIAC:  RRR, no murmurs, no rubs, no gallops RESPIRATORY:  Clear to auscultation without rales, wheezing or rhonchi  ABDOMEN: Soft, non-tender, non-distended EXTREMITIES:  No edema; No deformity   ASSESSMENT AND PLAN: .    Assessment and Plan Assessment & Plan Primary hypertension Hypertension improved with current medication. Previous high sodium intake and anxiety contributed to elevated levels.  Reportedly did not occur in the setting of Adderall use. - Continue amlodipine , metoprolol  tartrate, and aspirin . - Encouraged reduced sodium intake. - Given hypokalemia and hypertension may consider secondary workup for hyperaldosteronism however he seems to be responding well to the amlodipine  which he started only 1 week ago  Coronary artery disease Left heart catheterization (05/20/2020): Normal left main; large LAD with minimal luminal irregularities; large circumflex with minor irregularities except for very distal AV circumflex with tandem 90% lesions; very distal OM4 with 75% proximal lesion in a relatively small vessel; RCA large with minor irregularities; mid PDA relatively free of disease; PL with tandem 70% and 90% lesions in a small vessel without collaterals.  Was having chest pain in the setting of hypertensive urgency. - Increased rosuvastatin  to 20 mg daily. - Ordered echocardiogram - Continue aspirin  81  mg - Will order a PET/CT to evaluate for high risk disease given his contrast allergy for ischemic evaluation.  Chest pain, possibly cardiac - Plan as above  Hyperlipidemia Elevated LDL, triglycerides, and VLDL.  - Target LDL <70 mg/dL. - Increased rosuvastatin  to 20 mg daily. - Ordered lipoprotein a - Repeat lipid panel in 3 months - Encouraged dietary modifications to reduce sugar intake and increase healthy fats.  Hypokalemia Previous hypokalemia - He is going to restart the potassium supplement and monitor for rash.       Informed Consent   Shared Decision Making/Informed Consent The risks [chest pain, shortness of breath, cardiac arrhythmias, dizziness, blood pressure fluctuations, myocardial infarction, stroke/transient ischemic attack, nausea, vomiting, allergic reaction, radiation exposure, metallic taste sensation and life-threatening complications (estimated to be 1 in 10,000)], benefits (risk stratification, diagnosing coronary artery disease, treatment guidance) and alternatives of a cardiac PET stress test were discussed in detail with Mr. Jun and he agrees to proceed.       Follow up: 3 months  Signed, Emeline FORBES Calender, DO  12/25/2024 2:55 PM    [facility] "

## 2024-12-26 ENCOUNTER — Ambulatory Visit: Payer: Self-pay | Admitting: Internal Medicine

## 2024-12-26 DIAGNOSIS — E785 Hyperlipidemia, unspecified: Secondary | ICD-10-CM

## 2024-12-26 LAB — LIPID PANEL
Chol/HDL Ratio: 7.2 ratio — ABNORMAL HIGH (ref 0.0–5.0)
Cholesterol, Total: 259 mg/dL — ABNORMAL HIGH (ref 100–199)
HDL: 36 mg/dL — ABNORMAL LOW
LDL Chol Calc (NIH): 179 mg/dL — ABNORMAL HIGH (ref 0–99)
Triglycerides: 232 mg/dL — ABNORMAL HIGH (ref 0–149)
VLDL Cholesterol Cal: 44 mg/dL — ABNORMAL HIGH (ref 5–40)

## 2024-12-31 ENCOUNTER — Ambulatory Visit: Admitting: Family Medicine

## 2025-01-01 LAB — LIPOPROTEIN A (LPA): Lipoprotein (a): 19.4 nmol/L

## 2025-01-02 ENCOUNTER — Encounter: Payer: Self-pay | Admitting: Family Medicine

## 2025-01-02 ENCOUNTER — Ambulatory Visit: Admitting: Family Medicine

## 2025-01-02 VITALS — BP 132/86 | HR 84 | Temp 97.8°F | Ht 71.0 in | Wt 200.0 lb

## 2025-01-02 DIAGNOSIS — E785 Hyperlipidemia, unspecified: Secondary | ICD-10-CM | POA: Insufficient documentation

## 2025-01-02 DIAGNOSIS — R7989 Other specified abnormal findings of blood chemistry: Secondary | ICD-10-CM

## 2025-01-02 DIAGNOSIS — E559 Vitamin D deficiency, unspecified: Secondary | ICD-10-CM

## 2025-01-02 DIAGNOSIS — R41840 Attention and concentration deficit: Secondary | ICD-10-CM

## 2025-01-02 DIAGNOSIS — I10 Essential (primary) hypertension: Secondary | ICD-10-CM

## 2025-01-02 DIAGNOSIS — E876 Hypokalemia: Secondary | ICD-10-CM

## 2025-01-02 DIAGNOSIS — Z87438 Personal history of other diseases of male genital organs: Secondary | ICD-10-CM

## 2025-01-02 LAB — COMPREHENSIVE METABOLIC PANEL WITH GFR
ALT: 57 U/L — ABNORMAL HIGH (ref 3–53)
AST: 33 U/L (ref 5–37)
Albumin: 4.6 g/dL (ref 3.5–5.2)
Alkaline Phosphatase: 86 U/L (ref 39–117)
BUN: 15 mg/dL (ref 6–23)
CO2: 32 meq/L (ref 19–32)
Calcium: 10.2 mg/dL (ref 8.4–10.5)
Chloride: 99 meq/L (ref 96–112)
Creatinine, Ser: 1.22 mg/dL (ref 0.40–1.50)
GFR: 70.49 mL/min
Glucose, Bld: 84 mg/dL (ref 70–99)
Potassium: 3.8 meq/L (ref 3.5–5.1)
Sodium: 138 meq/L (ref 135–145)
Total Bilirubin: 0.5 mg/dL (ref 0.2–1.2)
Total Protein: 7.4 g/dL (ref 6.0–8.3)

## 2025-01-02 MED ORDER — AMPHETAMINE-DEXTROAMPHETAMINE 30 MG PO TABS: 30.0000 mg | ORAL_TABLET | Freq: Two times a day (BID) | ORAL | 0 refills | Status: AC

## 2025-01-02 MED ORDER — AMPHETAMINE-DEXTROAMPHETAMINE 5 MG PO TABS: 5.0000 mg | ORAL_TABLET | Freq: Two times a day (BID) | ORAL | 0 refills | Status: AC

## 2025-01-02 NOTE — Patient Instructions (Addendum)
-  It was great to see you today and I am so proud of you working on improving your health.  -Ordered lab to evaluate potassium level. Office will call with lab result and will send via MyChart. Also, will send a copy to cardiology.  -Placed a referral to urology for low testosterone  and history of epididymitis. Please call the office or send a MyChart message if you do not receive a phone call or a MyChart message about appointment in 2 weeks.  -Continue all medications. Refilled Adderall 30mg  twice a day and 5mg  twice a day.  -Continue Rosuvastatin  20mg  daily at bedtime as increased by cardiology. You need to be fasting when lipids are drawn in 3 months with cardiology. - Recommend over the counter Vitamin D3 5,000IU tablet daily. Will recheck at next appointment in April.  -Blood pressure has improved. Continue taking Amlodipine  and Metoprolol . Continue to monitor blood pressures at least 2-3 times a week. Follow up if consistently above 140/90. Please upload BP record to MyChart.  -Follow up in 2 months.

## 2025-01-02 NOTE — Assessment & Plan Note (Signed)
 Took all of the prescribed Vitamin D . Recommend over the counter Vitamin D3 5,000IU tablet daily. Will recheck at next appointment in April.

## 2025-01-02 NOTE — Assessment & Plan Note (Signed)
 Continue with Rosuvastatin  20mg  daily at bedtime as increased by cardiology. Will need to be fasting when having lipid drawn in 3 months.

## 2025-01-02 NOTE — Assessment & Plan Note (Signed)
 Continue taking Adderall 30 BID and Adderall 5mg  BID to equal 35mg  BID. UDS and controlled substance contract UTD. PDMP reviewed. Last refill was on 01/06 for the 30mg  and 12/31 for the 5mg  by previous PCP. Refilled medication today.

## 2025-01-02 NOTE — Assessment & Plan Note (Signed)
-  Blood pressure has improved. Continue taking Amlodipine  and Metoprolol . Continue to monitor blood pressures at least 2-3 times a week. Follow up if consistently above 140/90. Advised to please upload BP record to MyChart.

## 2025-01-02 NOTE — Progress Notes (Signed)
 "  Established Patient Office Visit   Subjective:  Patient ID: Bradley Quinn, male    DOB: 10/30/1976  Age: 49 y.o. MRN: [id number]  Chief Complaint  Patient presents with   Medical Management of Chronic Issues    2 week follow up     HPI HTN: Chronic. Patient is taking Amlodipine  and Metoprolol  25mg  BID. At last appointment, he had just started Amlodipine  4 days prior. He reports he has been monitoring his BP at home. He does not have reading today, but reports they are usually around 135-140/80-90. Reports chest pain has improved. Headaches have improved. Denies SHOB, dizziness, lightheadedness, or lower extremity edema. He seen Dr. Emeline Quinn with Bradley Quinn on 01/29. He has a workup scheduled for CAD and follow up in April.   Cardiology did increase Rosuvastatin  to 20mg  from 10mg  tablet.   He has vitamin D  deficiency, 20.67 on 01/08. He was prescribed Vitamin D3 50,000IU every 7 days, but accidentally has been taking it more frequently.   ADHD: Chronic. Patient is taking Adderall 30mg  BID and Adderall 5mg  BID. Effective. Requesting a refill on medications.   ROS See HPI above     Objective:   BP 132/86   Pulse 84   Temp 97.8 F (36.6 C) (Oral)   Ht 5' 11 (1.803 m)   Wt 200 lb (90.7 kg)   SpO2 99%   BMI 27.89 kg/m  BP Readings from Last 3 Encounters:  01/02/25 132/86  12/25/24 (!) 140/94  12/18/24 (!) 148/84      Physical Exam Vitals reviewed.  Constitutional:      General: He is not in acute distress.    Appearance: Normal appearance. He is overweight. He is not ill-appearing, toxic-appearing or diaphoretic.  HENT:     Head: Normocephalic and atraumatic.  Eyes:     General:        Right eye: No discharge.        Left eye: No discharge.     Conjunctiva/sclera: Conjunctivae normal.  Cardiovascular:     Rate and Rhythm: Normal rate and regular rhythm.     Heart sounds: Normal heart sounds. No murmur heard.    No friction rub. No gallop.  Pulmonary:      Effort: Pulmonary effort is normal. No respiratory distress.     Breath sounds: Normal breath sounds.  Musculoskeletal:        General: Normal range of motion.  Skin:    General: Skin is warm and dry.  Neurological:     General: No focal deficit present.     Mental Status: He is alert and oriented to person, place, and time. Mental status is at baseline.  Psychiatric:        Mood and Affect: Mood normal.        Behavior: Behavior normal.        Thought Content: Thought content normal.        Judgment: Judgment normal.      Assessment & Plan:  Low blood potassium -     Comprehensive metabolic panel with GFR  Low testosterone  in male -     Ambulatory referral to Urology  Attention or concentration deficit Assessment & Plan: Continue taking Adderall 30 BID and Adderall 5mg  BID to equal 35mg  BID. UDS and controlled substance contract UTD. PDMP reviewed. Last refill was on 01/06 for the 30mg  and 12/31 for the 5mg  by previous PCP. Refilled medication today.   Orders: -     Amphetamine -Dextroamphetamine ; Take  1 tablet by mouth 2 (two) times daily.  Dispense: 60 tablet; Refill: 0 -     Amphetamine -Dextroamphetamine ; Take 1 tablet (5 mg total) by mouth in the morning and at bedtime.  Dispense: 60 tablet; Refill: 0  Hyperlipidemia, unspecified hyperlipidemia type Assessment & Plan: Continue with Rosuvastatin  20mg  daily at bedtime as increased by cardiology. Will need to be fasting when having lipid drawn in 3 months.    Vitamin D  deficiency Assessment & Plan: Took all of the prescribed Vitamin D . Recommend over the counter Vitamin D3 5,000IU tablet daily. Will recheck at next appointment in April.    Primary hypertension Assessment & Plan: -Blood pressure has improved. Continue taking Amlodipine  and Metoprolol . Continue to monitor blood pressures at least 2-3 times a week. Follow up if consistently above 140/90. Advised to please upload BP record to MyChart.    History of  epididymitis -     Ambulatory referral to Urology  -Ordered lab to evaluate potassium level. Office will call with lab result and will send via MyChart. Also, will send a copy to cardiology.  -Placed a referral to urology for low testosterone  and history of epididymitis.  Return in about 2 months (around 03/02/2025) for physical.   Bradley Massaro, NP "

## 2025-01-06 ENCOUNTER — Ambulatory Visit

## 2025-01-15 ENCOUNTER — Ambulatory Visit

## 2025-01-27 ENCOUNTER — Ambulatory Visit

## 2025-02-26 ENCOUNTER — Ambulatory Visit: Admitting: Nurse Practitioner

## 2025-03-25 ENCOUNTER — Ambulatory Visit: Admitting: Internal Medicine
